# Patient Record
Sex: Female | Born: 1945
Health system: Southern US, Community
[De-identification: ages and names within clinical notes are randomized; demographics above are authoritative.]

## PROBLEM LIST (undated history)

## (undated) DIAGNOSIS — R9431 Abnormal electrocardiogram [ECG] [EKG]: Secondary | ICD-10-CM

## (undated) DIAGNOSIS — R0789 Other chest pain: Secondary | ICD-10-CM

## (undated) DIAGNOSIS — G4730 Sleep apnea, unspecified: Secondary | ICD-10-CM

## (undated) DIAGNOSIS — F329 Major depressive disorder, single episode, unspecified: Secondary | ICD-10-CM

## (undated) DIAGNOSIS — F32A Depression, unspecified: Secondary | ICD-10-CM

## (undated) DIAGNOSIS — I491 Atrial premature depolarization: Secondary | ICD-10-CM

## (undated) DIAGNOSIS — I493 Ventricular premature depolarization: Secondary | ICD-10-CM

## (undated) DIAGNOSIS — K219 Gastro-esophageal reflux disease without esophagitis: Secondary | ICD-10-CM

## (undated) DIAGNOSIS — E876 Hypokalemia: Secondary | ICD-10-CM

## (undated) DIAGNOSIS — I10 Essential (primary) hypertension: Secondary | ICD-10-CM

## (undated) DIAGNOSIS — L570 Actinic keratosis: Secondary | ICD-10-CM

## (undated) DIAGNOSIS — I2510 Atherosclerotic heart disease of native coronary artery without angina pectoris: Secondary | ICD-10-CM

## (undated) DIAGNOSIS — R109 Unspecified abdominal pain: Secondary | ICD-10-CM

## (undated) DIAGNOSIS — E785 Hyperlipidemia, unspecified: Secondary | ICD-10-CM

## (undated) DIAGNOSIS — N63 Unspecified lump in unspecified breast: Secondary | ICD-10-CM

## (undated) HISTORY — PX: CATARACT EXTRACTION: SUR2

## (undated) HISTORY — DX: Sleep apnea, unspecified: G47.30

## (undated) HISTORY — DX: Atrial premature depolarization: I49.1

## (undated) HISTORY — DX: Unspecified lump in unspecified breast: N63.0

## (undated) HISTORY — PX: VESICOVAGINAL FISTULA CLOSURE W/ TAH: SUR271

## (undated) HISTORY — DX: Abnormal electrocardiogram (ECG) (EKG): R94.31

## (undated) HISTORY — DX: Essential (primary) hypertension: I10

## (undated) HISTORY — DX: Gastro-esophageal reflux disease without esophagitis: K21.9

## (undated) HISTORY — PX: TOTAL ABDOMINAL HYSTERECTOMY W/ BILATERAL SALPINGOOPHORECTOMY: SHX83

## (undated) HISTORY — DX: Atherosclerotic heart disease of native coronary artery without angina pectoris: I25.10

## (undated) HISTORY — DX: Unspecified abdominal pain: R10.9

## (undated) HISTORY — DX: Actinic keratosis: L57.0

## (undated) HISTORY — PX: APPENDECTOMY: SHX54

## (undated) HISTORY — DX: Hypokalemia: E87.6

## (undated) HISTORY — DX: Ventricular premature depolarization: I49.3

## (undated) HISTORY — DX: Depression, unspecified: F32.A

## (undated) HISTORY — DX: Other chest pain: R07.89

## (undated) HISTORY — PX: COLONOSCOPY: SHX174

## (undated) HISTORY — DX: Hyperlipidemia, unspecified: E78.5

## (undated) HISTORY — DX: Major depressive disorder, single episode, unspecified: F32.9

## (undated) HISTORY — PX: CARDIAC CATHETERIZATION: SHX172

---

## 1999-12-21 HISTORY — PX: OTHER SURGICAL HISTORY: SHX169

## 2005-01-04 ENCOUNTER — Ambulatory Visit: Payer: Self-pay | Admitting: Gerontology

## 2005-01-07 ENCOUNTER — Ambulatory Visit: Payer: Self-pay

## 2005-03-01 ENCOUNTER — Ambulatory Visit: Payer: Self-pay | Admitting: Internal Medicine

## 2006-04-13 ENCOUNTER — Ambulatory Visit: Payer: Self-pay | Admitting: Internal Medicine

## 2007-01-26 ENCOUNTER — Other Ambulatory Visit: Payer: Self-pay

## 2007-02-03 ENCOUNTER — Ambulatory Visit: Payer: Self-pay | Admitting: Unknown Physician Specialty

## 2007-04-20 ENCOUNTER — Ambulatory Visit: Payer: Self-pay | Admitting: Internal Medicine

## 2007-05-30 ENCOUNTER — Other Ambulatory Visit: Payer: Self-pay

## 2007-05-30 ENCOUNTER — Inpatient Hospital Stay: Payer: Self-pay | Admitting: Internal Medicine

## 2007-06-06 ENCOUNTER — Ambulatory Visit: Payer: Self-pay | Admitting: Internal Medicine

## 2007-06-13 ENCOUNTER — Ambulatory Visit: Payer: Self-pay | Admitting: Internal Medicine

## 2007-07-05 ENCOUNTER — Ambulatory Visit: Payer: Self-pay | Admitting: Internal Medicine

## 2007-10-02 ENCOUNTER — Ambulatory Visit: Payer: Self-pay | Admitting: Physician Assistant

## 2008-04-22 ENCOUNTER — Ambulatory Visit: Payer: Self-pay | Admitting: Internal Medicine

## 2008-06-20 ENCOUNTER — Ambulatory Visit: Payer: Self-pay | Admitting: Internal Medicine

## 2008-07-22 ENCOUNTER — Ambulatory Visit: Payer: Self-pay | Admitting: Internal Medicine

## 2009-06-17 ENCOUNTER — Ambulatory Visit: Payer: Self-pay | Admitting: Internal Medicine

## 2009-07-08 ENCOUNTER — Ambulatory Visit: Payer: Self-pay | Admitting: Internal Medicine

## 2010-01-01 ENCOUNTER — Encounter: Payer: Self-pay | Admitting: Internal Medicine

## 2010-01-06 ENCOUNTER — Encounter: Payer: Self-pay | Admitting: Internal Medicine

## 2010-02-09 ENCOUNTER — Ambulatory Visit: Payer: Self-pay | Admitting: Internal Medicine

## 2010-02-09 DIAGNOSIS — I4949 Other premature depolarization: Secondary | ICD-10-CM | POA: Insufficient documentation

## 2010-02-09 DIAGNOSIS — E7800 Pure hypercholesterolemia, unspecified: Secondary | ICD-10-CM | POA: Insufficient documentation

## 2010-02-09 DIAGNOSIS — I10 Essential (primary) hypertension: Secondary | ICD-10-CM | POA: Insufficient documentation

## 2010-02-09 DIAGNOSIS — I4891 Unspecified atrial fibrillation: Secondary | ICD-10-CM | POA: Insufficient documentation

## 2010-02-13 ENCOUNTER — Ambulatory Visit: Payer: Self-pay | Admitting: Physician Assistant

## 2010-08-05 ENCOUNTER — Ambulatory Visit: Payer: Self-pay | Admitting: Internal Medicine

## 2010-12-30 ENCOUNTER — Encounter: Payer: Self-pay | Admitting: Internal Medicine

## 2011-01-20 NOTE — Assessment & Plan Note (Signed)
Summary: ROV/AMD   Visit Type:  Follow-up Primary Provider:  Daniel Nones, MD  CC:  no complaints.  History of Present Illness: Christina Underwood is seen at the request of Dr. Daniel Nones 4 followup of PVCs and PACs that were very symptomatic and were treated with amiodarone about 4 years ago. We last saw the patient about 2-1/2 years ago.  She has no significant palpitations. She denies balance issues, GI issues, photophobia, alopecia. Dr. Graciela Husbands has check LFTs and TSH as it is this month or so ago. These were reportedly normal.  She underwent catheterization many years ago with normal left ventricular function. An echo an emergent echo showed mild depression of LV systolic function. She does not recall any intercurrent evaluation.  The patient denies SOB, chest pain, edema or palpitations.     Preventive Screening-Counseling & Management  Alcohol-Tobacco     Alcohol drinks/day: 0     Smoking Status: never  Caffeine-Diet-Exercise     Caffeine use/day: 2 cups a day     Does Patient Exercise: no  Current Problems (verified): 1)  Cad, Native Vessel  (ICD-414.01) 2)  Chest Pain-unspecified  (ICD-786.50) 3)  Hypertension, Benign  (ICD-401.1) 4)  Hypercholesterolemia Iia  (ICD-272.0)  Current Medications (verified): 1)  Micardis Hct 80-25 Mg Tabs (Telmisartan-Hctz) .Marland Kitchen.. 1 By Mouth Once Daily 2)  Lipitor 20 Mg Tabs (Atorvastatin Calcium) .Marland Kitchen.. 1 By Mouth Once Daily 3)  Premarin 0.625 Mg Tabs (Estrogens Conjugated) .Marland Kitchen.. 1 By Mouth Once Daily 4)  Bupropion Hcl 150 Mg Xr12h-Tab (Bupropion Hcl) .Marland Kitchen.. 1 By Mouth Once Daily 5)  Potassium Chloride Cr 10 Meq Cr-Caps (Potassium Chloride) .... Take One Tablet By Mouth Daily 6)  Amiodarone Hcl 200 Mg Tabs (Amiodarone Hcl) .... Take One Tablet By Mouth Daily 7)  Citalopram Hydrobromide 40 Mg Tabs (Citalopram Hydrobromide) .Marland Kitchen.. 1 By Mouth Once Daily 8)  Aspirin 81 Mg Tbec (Aspirin) .... Take One Tablet By Mouth Daily 9)  Zyrtec Hives Relief 10 Mg Tabs  (Cetirizine Hcl) .Marland Kitchen.. 1 By Mouth Once Daily 10)  Nexium 40 Mg Cpdr (Esomeprazole Magnesium) .Marland Kitchen.. 1 By Mouth Once Daily 11)  Multivitamins  Tabs (Multiple Vitamin) .Marland Kitchen.. 1 By Mouth Once Daily 12)  Calcium 1200-1000 Mg-Unit Chew (Calcium Carbonate-Vit D-Min) .Marland Kitchen.. 1 By Mouth Once Daily 13)  Ra Hair/skin/nails  Tabs (Multiple Vitamins-Minerals) .Marland Kitchen.. 1 By Mouth Once Daily  Allergies (verified): 1)  ! Sulfa  Past History:  Past Medical History: Last updated: 02/09/2010 CAD GERD Hyperlipidemia Hypertension Depression Atypical chest pain Abdominal discomfort in the right upper quadrant Hypokalemia Breast lump  Prolonged of QT syndrome  Sleep apnea  Past Surgical History: Last updated: 02/09/2010 Hysterectomy Bunion repairs bilaterally History of lumbar laminextomy 2001 Appendectomy  Social History: Last updated: 02/09/2010 Full Time Tobacco Use - No.  Alcohol Use - no  Risk Factors: Alcohol Use: 0 (02/09/2010) Caffeine Use: 2 cups a day (02/09/2010) Exercise: no (02/09/2010)  Risk Factors: Smoking Status: never (02/09/2010)  Social History: Alcohol drinks/day:  0 Caffeine use/day:  2 cups a day Does Patient Exercise:  no  Vital Signs:  Patient profile:   65 year old female Height:      65 inches Weight:      209.75 pounds BMI:     35.03 Pulse rate:   67 / minute Pulse rhythm:   regular BP sitting:   140 / 68  (left arm) Cuff size:   large  Vitals Entered By: Mercer Pod (February 09, 2010 10:14 AM)  EKG  Procedure date:  02/09/2010  Findings:      sinus rhythm at 67 Intervals 0.20/0.07/0.44 Axis is 11 Nonspecific ST-T changes  Impression & Recommendations:  Problem # 1:  PREMATURE VENTRICULAR CONTRACTIONS (ICD-427.69) The patient has very symptomatic PVCs. These have been largely eliminated by the use of amiodarone. She as tolerated it well without evidence of complications. Amiodarone surveillance is being done by Dr. Graciela Husbands  decrease  her amiodarone dose from 200-100 mg a day.  Her updated medication list for this problem includes:    Amiodarone Hcl 200 Mg Tabs (Amiodarone hcl) .Marland Kitchen... Take 1/2 tablet by mouth daily    Aspirin 81 Mg Tbec (Aspirin) .Marland Kitchen... Take one tablet by mouth daily  Problem # 2:  ENCOUNTER FOR AMIODARONE (ICD-V58.69) as above  Problem # 3:  CAD, NATIVE VESSEL-NONOBSTRUCTIVE (ICD-414.01) no symptoms we'll continue her on aspirin Her updated medication list for this problem includes:    Aspirin 81 Mg Tbec (Aspirin) .Marland Kitchen... Take one tablet by mouth daily  Patient Instructions: 1)  Your physician recommends that you schedule a follow-up appointment in: 1 year 2)  Your physician has recommended you make the following change in your medication: decrease amiodarone to 100 mg daily

## 2011-01-20 NOTE — Progress Notes (Signed)
Summary: [facility]   Imported By: Harlon Flor 01/08/2010 12:07:09  _____________________________________________________________________  External Attachment:    Type:   Image     Comment:   External Document

## 2011-02-12 ENCOUNTER — Encounter: Payer: Self-pay | Admitting: Internal Medicine

## 2011-02-12 ENCOUNTER — Ambulatory Visit (INDEPENDENT_AMBULATORY_CARE_PROVIDER_SITE_OTHER): Payer: BC Managed Care – PPO | Admitting: Internal Medicine

## 2011-02-12 DIAGNOSIS — Z7901 Long term (current) use of anticoagulants: Secondary | ICD-10-CM

## 2011-02-12 DIAGNOSIS — I4949 Other premature depolarization: Secondary | ICD-10-CM

## 2011-02-16 NOTE — Miscellaneous (Signed)
  Clinical Lists Changes  Medications: Changed medication from AMIODARONE HCL 200 MG TABS (AMIODARONE HCL) Take 1/2 tablet by mouth daily to AMIODARONE HCL 200 MG TABS (AMIODARONE HCL) Take 1/2 tablet by mouth 5 days per week - Signed

## 2011-02-16 NOTE — Assessment & Plan Note (Signed)
Summary: f1y/amd/smw 427.69; 427.61; v58.65   Visit Type:  Follow-up Primary Provider:  Daniel Nones, MD  CC:  "Doing well.".  History of Present Illness: Christina Underwood is seen in followup for  PVCs and PACs that were very symptomatic and were treated with amiodarone about 4 years ago.   she has had largely depression of her ectopy with the amiodaron; surveillance laboratories have been followed by Dr. Worthy Keeler.  These labs were reviewed today. A TSH and liver enzymes were normal. These date January 2012  She underwent catheterization many years ago with normal left ventricular function. An echo an emergent echo showed mild depression of LV systolic function. She does not recall any intercurrent evaluation.  The patient denies SOB, chest pain, edema or palpitations.     Current Medications (verified): 1)  Micardis Hct 80-25 Mg Tabs (Telmisartan-Hctz) .Marland Kitchen.. 1 By Mouth Once Daily 2)  Lipitor 40 Mg Tabs (Atorvastatin Calcium) .... One Tablet At Bedtime 3)  Premarin 0.625 Mg Tabs (Estrogens Conjugated) .Marland Kitchen.. 1 By Mouth Once Daily 4)  Bupropion Hcl 150 Mg Xr12h-Tab (Bupropion Hcl) .Marland Kitchen.. 1 By Mouth Once Daily 5)  Potassium Chloride Cr 10 Meq Cr-Caps (Potassium Chloride) .... Take One Tablet By Mouth Daily 6)  Amiodarone Hcl 200 Mg Tabs (Amiodarone Hcl) .... Take 1/2 Tablet By Mouth Daily 7)  Citalopram Hydrobromide 40 Mg Tabs (Citalopram Hydrobromide) .Marland Kitchen.. 1 By Mouth Once Daily 8)  Aspirin 81 Mg Tbec (Aspirin) .... Take One Tablet By Mouth Daily 9)  Zyrtec Hives Relief 10 Mg Tabs (Cetirizine Hcl) .Marland Kitchen.. 1 By Mouth Once Daily 10)  Nexium 40 Mg Cpdr (Esomeprazole Magnesium) .Marland Kitchen.. 1 By Mouth Once Daily 11)  Multivitamins  Tabs (Multiple Vitamin) .Marland Kitchen.. 1 By Mouth Once Daily 12)  Calcium 1200-1000 Mg-Unit Chew (Calcium Carbonate-Vit D-Min) .Marland Kitchen.. 1 By Mouth Once Daily 13)  Ra Hair/skin/nails  Tabs (Multiple Vitamins-Minerals) .Marland Kitchen.. 1 By Mouth Once Daily 14)  Benzonatate 200 Mg Caps (Benzonatate) ....  Three Times A Day For Cough. 15)  Doxycycline Hyclate 100 Mg Caps (Doxycycline Hyclate) .... Two Times A Day X 10 Days. 16)  Doxazosin Mesylate 1 Mg Tabs (Doxazosin Mesylate) .... One Daily  Allergies (verified): 1)  ! Sulfa  Past History:  Past Medical History: Last updated: 02/09/2010 CAD GERD Hyperlipidemia Hypertension Depression Atypical chest pain Abdominal discomfort in the right upper quadrant Hypokalemia Breast lump  Prolonged of QT syndrome  Sleep apnea  Past Surgical History: Last updated: 02/09/2010 Hysterectomy Bunion repairs bilaterally History of lumbar laminextomy 2001 Appendectomy  Social History: Last updated: 02/09/2010 Full Time Tobacco Use - No.  Alcohol Use - no  Risk Factors: Alcohol Use: 0 (02/09/2010) Caffeine Use: 2 cups a day (02/09/2010) Exercise: no (02/09/2010)  Risk Factors: Smoking Status: never (02/09/2010)  Vital Signs:  Patient profile:   65 year old female Height:      65 inches Weight:      202 pounds BMI:     33.74 Pulse rate:   63 / minute BP sitting:   102 / 72  (left arm) Cuff size:   large  Vitals Entered By: Bishop Dublin, CMA (February 12, 2011 10:31 AM)  Physical Exam  General:  The patient was alert and oriented in no acute distress. HEENT Normal.   Lungs were clear.  Heart sounds were regular without murmurs or gallops.  Abdomen was soft with active bowel sounds. There is no clubbing cyanosis or edema. Skin Warm and dry    EKG  Procedure date:  02/12/2011  Findings:      sinus rhythm at 63 Intervals 0.18/2007/25 through the QTC of 0.55  Impression & Recommendations:  Problem # 1:  PREMATURE VENTRICULAR CONTRACTIONS (ICD-427.69) these are well controlled on amiodarone. We will reduce the dose from 700-500 mg per week. Her updated medication list for this problem includes:    Amiodarone Hcl 200 Mg Tabs (Amiodarone hcl) .Marland Kitchen... Take 1/2 tablet by mouth daily    Aspirin 81 Mg Tbec (Aspirin)  .Marland Kitchen... Take one tablet by mouth daily  Problem # 2:  ABNORMAL ELECTROCARDIOGRAM-QT PROLONGATION 2/2 AMIODARONE (ICD-794.31) QT prolongation is almost totally a consequence of the amiodarone. Her potassium from last month was adequate at 3.7. Her updated medication list for this problem includes:    Amiodarone Hcl 200 Mg Tabs (Amiodarone hcl) .Marland Kitchen... Take 1/2 tablet by mouth daily    Aspirin 81 Mg Tbec (Aspirin) .Marland Kitchen... Take one tablet by mouth daily  Problem # 3:  ENCOUNTER FOR AMIODARONE (ICD-V58.69) she is tolerating this drug well. We will reduce the dose as noted above  Problem # 4:  CAD, NATIVE VESSEL-NONOBSTRUCTIVE (ICD-414.01) stable on current meds Her updated medication list for this problem includes:    Aspirin 81 Mg Tbec (Aspirin) .Marland Kitchen... Take one tablet by mouth daily

## 2011-05-04 NOTE — Letter (Signed)
June 13, 2007    Lynnea Ferrier, M.D.  [facility]  [address]  [place], [place]  [address]   RE:  Christina Underwood, Christina Underwood  MRN:  [id number]  /  DOB:  June 24, 1946   Dear Doristine Church:   It was a pleasure to see Christina Underwood today at your request regarding her  supraventricular ectopy.   Our sincere thanks also to Maralyn Sago, who was able to track down at least a  cover sheet for her Holter monitor.   As you know, she is a 65 year old woman whose past medical history is  quite complicated and includes depression and anxiety.  She has a  history of palpitations dating back a number of years for which she  first came to clinical attention in 2004 or so according to your notes.  At that time, she saw a Dr. Dian Queen at [facility].  Holter  monitoring demonstrated PACs and  brief atrial fibrillation.  She was  put on atenolol.  This was initially increased from 50 to 100, but this  was complicated by bradycardia. Subsequently, it was down titrated, and  then more recently it has been switched to bisoprolol.  It was her  feeling that the atenolol at 100 did quite well.  The atenolol at 50 did  not, and the bisoprolol has done nothing really at all.   She describes skipping and beating in her chest with these.  They are  not particularly associated with exercise or any other clear  provocateurs.  They are associated with presyncope, weakness, and being  quite fatigued.  She had some chest pain and left arm radiation with one  of these episodes just a couple of weeks ago, as you know, and was  hospitalized.  She had a Myoview scan, I thought by her history, and  then she went on to catheterization for reasons that were not clear,  demonstrating 25% RCA stenosis.   During the stress portion of her nuclear test, there were some comments  about not regular beats, but she is not sure anymore specifically what  that was.   The Holter monitor that Maralyn Sago was able to help US obtain describes  3-5%  PACs and PVCs.  We do not have morphology information to know how  diverse the origins of these beats are.  It is further her observation  that these spells have gotten worse over the last 3 years.   She does not think that they are aggravated by exercise, adrenalin,  eating, etc.   Further embolic risk factors in the event that she does develop atrial  fibrillation include hypertension.   PAST MEDICAL HISTORY:  In addition to above, is notable for:  1. Allergies.  2. GE reflux disease.  3. Fatigue.   PAST SURGICAL HISTORY:  Notable for:  1. Hysterectomy.  2. Salivary gland surgery.  3. Back surgery.   SOCIAL HISTORY:  She is married; she has 3 children.  She works doing  sewing.  She does not use cigarettes, alcohol, or recreational drugs.   CURRENT MEDICATIONS:  1. Lipitor 20.  2. Premarin.  3. Potassium.  4. Aspirin 81.  5. Bisoprolol 5.  6. Nexium 40.  7. Clarinex.  8. Bupropion 150.  9. Robaxin 750.   ALLERGIES:  No known drug allergies.   FAMILY HISTORY:  Noncontributory.   REVIEW OF SYSTEMS:  Noncontributory.   PHYSICAL EXAMINATION:  GENERAL:  She is an older Caucasian female  appearing somewhat older than  her stated age of 67.  VITAL SIGNS:  Blood pressure was 141/67 with a pulse of 56, and there  was no orthostatic change.  HEENT:  Exam demonstrated no icterus or xanthomata.  NECK:  The neck veins were flat.  The carotids were brisk and full  bilaterally without bruits.  BACK:  Without kyphosis or scoliosis.  LUNGS:  Clear.  CARDIAC:  Heart sounds were regular without murmurs or gallops.  ABDOMEN:  Soft with active bowel sounds, without midline pulsation or  hepatomegaly.  EXTREMITIES:  Femoral pulses were 2+.  Distal pulses were intact.  There  was no clubbing, cyanosis, or edema.  NEUROLOGIC:  Exam was grossly normal.  SKIN:  Warm and dry.   Electrocardiogram dated today demonstrated sinus rhythm at 59 with  intervals of 0.17/0.07/0.50.    IMPRESSION:  1. Symptomatic ventricular and supraventricular ectopy.  2. Nonobstructive coronary disease with a 25% right coronary artery      lesion, normal left ventricular function.  3. Long QT on electrocardiogram without evidence of long QT syndrome.  4. Anxiety and depression.   Doristine Church, Christina Underwood has symptomatic ectopy.  I tried to reassure her that  this was a non-life-threatening condition.  The issue of the presence of  nonobstructive coronary disease presents itself primarily in the  limitations that it has on drug therapy in that it would limit the use  of a 1C antiarrhythmic drug which I think would otherwise be quite  reasonable in her.   Alternatively, we have the use of calcium blockers and/or atrial-  ventricular remodeling agents, or antiarrhythmic drugs; as I mentioned  to you on the phone, I do not think ablation,  given the description of  her events, is reasonable, although I do not have access to the hard  copy of the rhythms themselves.   We discussed the potential benefits as well as the potential risks of  antiarrhythmic drugs.  With her coronary disease, as mentioned, she  would not be able to take a 1C agent, so she would be on sotalol,  amiodarone, or Tikosyn.  With QT prolongation, really the only drug  should could take is amiodarone.  We discussed the potential side  effects.   What we decided to do in the interim was to try an atrial-ventricular  remodeling agent in the form of an ARB, and I gave her Cozaar 150.  I  asked her to stop bisoprolol, and we put her on verapamil 120.  She is  supposed to see you in a couple of weeks, I think, and we will see how  it is that she is doing on that.   I told her to talk with you about how you would best like to follow up.  I am glad to help in whatever way I can, as frequently and as directly as you would like.  I will look forward to talking with you about her  prior to that.   I also need to explore her  medication list to see if any of them are  potentially responsible for QT prolongation on the electrocardiogram  because it really is quite striking.  It becomes important then also to  screen her children to see if they have evidence of QT prolongation and  certainly to note in her record that she should avoid QT prolonging  drugs.   Doristine Church, thanks very much for asking [place] to see her.    Sincerely,      Viviann Spare  C. Graciela Husbands, MD, [facility] [place]  Electronically Signed    SCK/MedQ  DD: 06/13/2007  DT: 06/13/2007  Job #: [id number]

## 2011-05-04 NOTE — Letter (Signed)
July 05, 2007    Dr. Daniel Nones  [facility]  [address]  [place], [place]  [address]   RE:  Christina Underwood  MRN:  [id number]  /  DOB:  1946-11-11   Dear Christina Underwood:   Christina Underwood came in today.  She has started to feel better over the last  couple of days since we initiated the amiodarone.   She mentioned that you had done the surveillance labs for the TSH and  LFTs.  I presume that they were normal.  She brings her PFTs with her  and her DLCO was 82% which is okay, and serves as a baseline.  She is  having no nausea at this point.   Her blood pressure remains a problem.  She notes that it is increased in  the morning.  I asked her whether she snored and she says she does not  know, but she does wake up with a.m. headaches.  She has daytime  somnolence and is poorly rested most of the time.   Her medication list is legion, but right now it includes Avapro 300 and  amiodarone 200 b.i.d., as well as potassium 10 b.i.d.  She takes her  furosemide as needed.   PHYSICAL EXAMINATION:  On examination today, her blood pressure is  152/76.  On a recheck it was 132/62, her pulse was 60, her weight is 203  which is up, according to our scales, by 6 pounds.  The lungs were clear.  Heart sounds were regular.  Extremities had no edema.   IMPRESSION:  1. Symptomatic PACs and PVCs.  2. Amiodarone initiated for #1.  3. Hypertension.  4. Daytime somnolence, a.m. headaches, question obstructive sleep      apnea.   Christina Underwood, Christina Underwood is doing pretty well.  I thought we would keep her on  her amiodarone at 200 twice a day for 3 weeks, and then, say, on August  5th, we will decrease the amiodarone to once a day.  I will look forward  to talking to you about how best we can follow her on her amiodarone so  that I can best assist you and not duplicate her care.   Initially what I will do is I will plan to see her in about 3 months  just to make sure she is tolerating the drug.   I am  also working on trying to get her an amiodarone surveillance form  so she has that with her.   As mentioned in the voice mail, I think there may be some value in a  sleep study, but I will defer that to you.   I hope this letter finds you well.  Let me know if there is anything I  can do.    Sincerely,      Duke Salvia, MD, [facility]  Electronically Signed    SCK/MedQ  DD: 07/05/2007  DT: 07/05/2007  Job #: [id number]

## 2011-07-07 ENCOUNTER — Encounter: Payer: Self-pay | Admitting: Internal Medicine

## 2011-08-02 ENCOUNTER — Encounter: Payer: Self-pay | Admitting: Internal Medicine

## 2011-08-26 ENCOUNTER — Ambulatory Visit: Payer: Self-pay | Admitting: Internal Medicine

## 2011-10-29 ENCOUNTER — Ambulatory Visit: Payer: Self-pay | Admitting: Internal Medicine

## 2011-11-05 ENCOUNTER — Other Ambulatory Visit: Payer: Self-pay | Admitting: Gastroenterology

## 2011-12-01 ENCOUNTER — Ambulatory Visit: Payer: Self-pay | Admitting: Gastroenterology

## 2011-12-01 ENCOUNTER — Other Ambulatory Visit: Payer: Self-pay | Admitting: Gastroenterology

## 2011-12-22 ENCOUNTER — Ambulatory Visit: Payer: Self-pay | Admitting: Gastroenterology

## 2011-12-23 LAB — PATHOLOGY REPORT

## 2012-08-28 ENCOUNTER — Ambulatory Visit: Payer: Self-pay | Admitting: Internal Medicine

## 2012-08-29 ENCOUNTER — Ambulatory Visit: Payer: Self-pay | Admitting: Internal Medicine

## 2013-02-13 ENCOUNTER — Encounter: Payer: Self-pay | Admitting: Internal Medicine

## 2013-02-13 ENCOUNTER — Ambulatory Visit (INDEPENDENT_AMBULATORY_CARE_PROVIDER_SITE_OTHER): Payer: Medicare Other | Admitting: Internal Medicine

## 2013-02-13 VITALS — BP 122/68 | HR 74 | Ht 66.0 in | Wt 201.8 lb

## 2013-02-13 DIAGNOSIS — I429 Cardiomyopathy, unspecified: Secondary | ICD-10-CM | POA: Insufficient documentation

## 2013-02-13 DIAGNOSIS — I428 Other cardiomyopathies: Secondary | ICD-10-CM

## 2013-02-13 DIAGNOSIS — R0602 Shortness of breath: Secondary | ICD-10-CM

## 2013-02-13 DIAGNOSIS — R079 Chest pain, unspecified: Secondary | ICD-10-CM

## 2013-02-13 DIAGNOSIS — I4949 Other premature depolarization: Secondary | ICD-10-CM

## 2013-02-13 MED ORDER — AMIODARONE HCL 200 MG PO TABS: ORAL_TABLET | ORAL | Status: DC

## 2013-02-13 NOTE — Patient Instructions (Addendum)
Your physician has recommended you make the following change in your medication:  -decrease amiodarone to 4 days a week  Your physician wants you to follow-up in: 1 year with Dr. Graciela Husbands. You will receive a reminder letter in the mail two months in advance. If you don't receive a letter, please call our office to schedule the follow-up appointment.

## 2013-02-13 NOTE — Assessment & Plan Note (Signed)
Previously noted to have a mild cardiomyopathy with an EF of 40-45%. I'll ask Dr. Gretel Acre to repeat her ultrasound. In the event that she has modest depression of LV function, continue her ACE inhibitor therapy and perhaps adding low-dose beta blocker would be appropriate

## 2013-02-13 NOTE — Assessment & Plan Note (Signed)
The patient has had symptomatic PACs and PVCs for years. She is on low-dose amiodarone. There is no evidence of amiodarone toxicity. We will decrease her amiodarone from 5 days a week-4 days a week.  Is reasonable to undertake pulmonary function testing particularly, not withstanding the absence of symptoms, that she is using the amiodarone for a relatively low risk issue. We will decrease the dose as noted.

## 2013-02-13 NOTE — Progress Notes (Signed)
Patient Care Team: Daniel Nones as PCP - General (Internal Medicine)   HPI  Christina Underwood is a 67 y.o. female Seen in followup after a hiatus of about 2 years. She has symptomatic PACs and PVCs. She was treated with amiodarone.she has been on low-dose therapy for some years, taking 100 mg 5 days a week. Surveillance laboratories have been followed by Dr. Gretel Acre.  She denies cough shortness of breath GI symptoms tremors gait disturbance or some sensitivity.  Remote catheterization demonstrated normal left ventricular function. A subsequent echo had showed mild depression of LV systolic function.  Past Medical History  Diagnosis Date  . CAD (coronary artery disease)   . GERD (gastroesophageal reflux disease)   . HLD (hyperlipidemia)   . HTN (hypertension)   . Depression   . Atypical chest pain   . Abdominal discomfort     R upper quad  . Hypokalemia   . Breast lump   . Sleep apnea   . Prolonged QT syndrome     Past Surgical History  Procedure Laterality Date  . Vesicovaginal fistula closure w/ tah    . Bunion repairs      bilateral   . Lumbar laminextomy  2001  . Appendectomy    . Total abdominal hysterectomy w/ bilateral salpingoophorectomy    . Cardiac catheterization      [facility]  . Cardiac catheterization      [facility].    Current Outpatient Prescriptions  Medication Sig Dispense Refill  . amiodarone (PACERONE) 200 MG tablet Take 100 mg by mouth. 5 days/week       . aspirin 81 MG EC tablet Take 81 mg by mouth daily.        Marland Kitchen atorvastatin (LIPITOR) 40 MG tablet Take 40 mg by mouth at bedtime.       . benzonatate (TESSALON) 200 MG capsule Take 200 mg by mouth 3 (three) times daily as needed.        Marland Kitchen buPROPion (WELLBUTRIN SR) 150 MG 12 hr tablet Take 150 mg by mouth daily.        . Calcium 1200-1000 MG-UNIT CHEW Chew 1 capsule by mouth daily.        . cetirizine (ZYRTEC) 10 MG tablet Take 10 mg by mouth daily.        . citalopram (CELEXA) 40 MG tablet Take 40  mg by mouth daily.        Marland Kitchen doxazosin (CARDURA) 1 MG tablet Take 1 mg by mouth daily.        Marland Kitchen esomeprazole (NEXIUM) 40 MG capsule Take 40 mg by mouth daily.        Marland Kitchen estrogens, conjugated, (PREMARIN) 0.625 MG tablet Take 0.625 mg by mouth daily. Take daily for 21 days then do not take for 7 days.       . Multiple Vitamin (MULTIVITAMIN) tablet Take 1 tablet by mouth daily.        . Multiple Vitamins-Minerals (RA HAIR/SKIN/NAILS) TABS Take 1 tablet by mouth daily.        . potassium chloride (MICRO-K) 10 MEQ CR capsule Take 10 mEq by mouth daily.        Marland Kitchen telmisartan-hydrochlorothiazide (MICARDIS HCT) 80-25 MG per tablet Take 1 tablet by mouth daily.         No current facility-administered medications for this visit.    Allergies  Allergen Reactions  . Sulfonamide Derivatives     Review of Systems negative except from HPI and PMH  Physical Exam  BP 122/68  Pulse 74  Ht 5\' 6"  (1.676 m)  Wt 201 lb 12 oz (91.513 kg)  BMI 32.58 kg/m2 Well developed and well nourished in no acute distress HENT normal E scleral and icterus clear Neck Supple JVP flat; carotids brisk and full Clear to ausculation Regular rate and rhythm, no murmurs gallops or rub Soft with active bowel sounds No clubbing cyanosis Trace Edema Alert and oriented, grossly normal motor and sensory function Skin Warm and Dry  ECG demonstrates sinus rhythm at 74 Intervals 18/08/41 Nonspecific T wave changes  Assessment and  Plan

## 2013-07-25 ENCOUNTER — Other Ambulatory Visit: Payer: Self-pay

## 2013-08-30 ENCOUNTER — Ambulatory Visit: Payer: Self-pay | Admitting: Internal Medicine

## 2013-09-21 ENCOUNTER — Ambulatory Visit: Payer: Self-pay

## 2013-10-25 ENCOUNTER — Other Ambulatory Visit: Payer: Self-pay

## 2014-04-19 ENCOUNTER — Ambulatory Visit (INDEPENDENT_AMBULATORY_CARE_PROVIDER_SITE_OTHER): Payer: Medicare HMO

## 2014-04-19 ENCOUNTER — Encounter: Payer: Self-pay | Admitting: Podiatry

## 2014-04-19 ENCOUNTER — Ambulatory Visit (INDEPENDENT_AMBULATORY_CARE_PROVIDER_SITE_OTHER): Payer: Medicare HMO | Admitting: Podiatry

## 2014-04-19 VITALS — Resp 16 | Ht 64.0 in | Wt 200.0 lb

## 2014-04-19 DIAGNOSIS — M898X9 Other specified disorders of bone, unspecified site: Secondary | ICD-10-CM

## 2014-04-19 DIAGNOSIS — M79609 Pain in unspecified limb: Secondary | ICD-10-CM

## 2014-04-19 DIAGNOSIS — M7750 Other enthesopathy of unspecified foot: Secondary | ICD-10-CM

## 2014-04-19 DIAGNOSIS — M79673 Pain in unspecified foot: Secondary | ICD-10-CM

## 2014-04-19 MED ORDER — TRIAMCINOLONE ACETONIDE 10 MG/ML IJ SUSP
10.0000 mg | Freq: Once | INTRAMUSCULAR | Status: AC
  Administered 2014-04-19: 10 mg

## 2014-04-19 NOTE — Progress Notes (Signed)
   Subjective:    Patient ID: Christina Underwood, female    DOB: 13-Aug-1946, 68 y.o.   MRN: [id number]  HPI Comments: i have pain on the top of my right foot and its been going on for about 2 months. i put some cream on my foot and take ibuprofen. It hurts worse at night. Over the months the pain is getting worse.  Foot Pain Associated symptoms include fatigue.      Review of Systems  Constitutional: Positive for fatigue.  Cardiovascular: Positive for leg swelling.  Musculoskeletal:       Back pain  All other systems reviewed and are negative.      Objective:   Physical Exam        Assessment & Plan:

## 2014-04-21 NOTE — Progress Notes (Signed)
Subjective:     Patient ID: Christina Underwood, female   DOB: [birth year], 68 y.o.   MRN: [id number]  Foot Pain   patient presents with pain on top of the right foot that is becoming increasingly difficult to walk with and she does not remember a specific injury. Has tried different creams and ibuprofen   Review of Systems  All other systems reviewed and are negative.      Objective:   Physical Exam  Nursing note and vitals reviewed. Constitutional: She is oriented to person, place, and time.  Cardiovascular: Intact distal pulses.   Musculoskeletal: Normal range of motion.  Neurological: She is oriented to person, place, and time.  Skin: Skin is warm.   neurovascular status intact with muscle strength adequate and discomfort on top of the right foot with inflammation and fluid buildup within the extensor complex. Did not note any muscle strength loss     Assessment:     Tendinitis of the dorsum right foot with inflammation and fluid buildup noted    Plan:     H&P and x-rays reviewed. Spent a great deal of time educating patient on problem and today I went ahead and didn't dorsal injection 3 mg Kenalog 5 mg I can Marcaine mixture and advised him reduced activity. Reappoint her recheck in 2 weeks

## 2014-05-10 ENCOUNTER — Encounter: Payer: Self-pay | Admitting: Podiatry

## 2014-05-10 ENCOUNTER — Ambulatory Visit (INDEPENDENT_AMBULATORY_CARE_PROVIDER_SITE_OTHER): Payer: Medicare HMO | Admitting: Podiatry

## 2014-05-10 VITALS — BP 134/63 | HR 73 | Resp 16

## 2014-05-10 DIAGNOSIS — M7750 Other enthesopathy of unspecified foot: Secondary | ICD-10-CM

## 2014-05-10 MED ORDER — TRIAMCINOLONE ACETONIDE 10 MG/ML IJ SUSP
10.0000 mg | Freq: Once | INTRAMUSCULAR | Status: AC
  Administered 2014-05-10: 10 mg

## 2014-05-10 NOTE — Progress Notes (Signed)
Subjective:     Patient ID: Christina Underwood, female   DOB: 25-Jan-1946, 68 y.o.   MRN: [id number]  HPI patient presents stating that it does not hurt where the injection was but it is now more around the first metatarsal base that is sore   Review of Systems     Objective:   Physical Exam Neurovascular status intact with no health history changes noted and pain around the first metatarsocuneiform joint noted. The discomfort in the dorsum of the foot extensor tendon complex is quite improved    Assessment:     Tendinitis of the anterior tibial tendon with improvement dorsal    Plan:     Injected around the anterior tibial tendon and first metatarsocuneiform area with 3 mg Kenalog 5 mg Xylocaine Marcaine mixture. Reappoint if symptoms persist

## 2014-08-08 ENCOUNTER — Encounter: Payer: Self-pay | Admitting: *Deleted

## 2014-08-15 ENCOUNTER — Ambulatory Visit: Payer: Self-pay | Admitting: Cardiovascular Disease

## 2014-09-06 ENCOUNTER — Ambulatory Visit: Payer: Self-pay | Admitting: Internal Medicine

## 2014-09-19 ENCOUNTER — Ambulatory Visit: Payer: Self-pay | Admitting: Internal Medicine

## 2014-10-28 DIAGNOSIS — R21 Rash and other nonspecific skin eruption: Secondary | ICD-10-CM | POA: Insufficient documentation

## 2015-01-07 ENCOUNTER — Encounter: Payer: Self-pay | Admitting: Internal Medicine

## 2015-01-07 ENCOUNTER — Ambulatory Visit (INDEPENDENT_AMBULATORY_CARE_PROVIDER_SITE_OTHER): Payer: Medicare Other | Admitting: Internal Medicine

## 2015-01-07 VITALS — BP 138/74 | HR 65 | Ht 64.5 in | Wt 202.5 lb

## 2015-01-07 DIAGNOSIS — R55 Syncope and collapse: Secondary | ICD-10-CM | POA: Insufficient documentation

## 2015-01-07 DIAGNOSIS — R079 Chest pain, unspecified: Secondary | ICD-10-CM | POA: Insufficient documentation

## 2015-01-07 DIAGNOSIS — R072 Precordial pain: Secondary | ICD-10-CM

## 2015-01-07 DIAGNOSIS — Z79899 Other long term (current) drug therapy: Secondary | ICD-10-CM

## 2015-01-07 NOTE — Patient Instructions (Signed)
[place]  Your caregiver has ordered a Stress Test with nuclear imaging. The purpose of this test is to evaluate the blood supply to your heart muscle. This procedure is referred to as a "Non-Invasive Stress Test." This is because other than having an IV started in your vein, nothing is inserted or "invades" your body. Cardiac stress tests are done to find areas of poor blood flow to the heart by determining the extent of coronary artery disease (CAD). Some patients exercise on a treadmill, which naturally increases the blood flow to your heart, while others who are  unable to walk on a treadmill due to physical limitations have a pharmacologic/chemical stress agent called Lexiscan . This medicine will mimic walking on a treadmill by temporarily increasing your coronary blood flow.   Please note: these test may take anywhere between 2-4 hours to complete  PLEASE REPORT TO [facility] AT THE FIRST DESK WILL DIRECT YOU WHERE TO GO  Date of Procedure:________1/22/16_____________________________  Arrival Time for Procedure:_______0745 am_______________________   PLEASE NOTIFY THE OFFICE AT LEAST 24 HOURS IN ADVANCE IF YOU ARE UNABLE TO KEEP YOUR APPOINTMENT.  [phone] AND  PLEASE NOTIFY NUCLEAR MEDICINE AT [facility] AT LEAST 24 HOURS IN ADVANCE IF YOU ARE UNABLE TO KEEP YOUR APPOINTMENT. [phone]  How to prepare for your Myoview test:  1. Do not eat or drink after midnight 2. No caffeine for 24 hours prior to test 3. No smoking 24 hours prior to test. 4. Your medication may be taken with water.  If your doctor stopped a medication because of this test, do not take that medication. 5. Ladies, please do not wear dresses.  Skirts or pants are appropriate. Please wear a short sleeve shirt. 6. No perfume, cologne or lotion. 7. Wear comfortable walking shoes. No heels!       Your physician has recommended that you wear an event monitor. Event monitors are  medical devices that record the heart's electrical activity. Doctors most often [place] these monitors to diagnose arrhythmias. Arrhythmias are problems with the speed or rhythm of the heartbeat. The monitor is a small, portable device. You can wear one while you do your normal daily activities. This is usually used to diagnose what is causing palpitations/syncope (passing out).  Your physician recommends that you schedule a follow-up appointment in:  5-6 weeks with Dr. Caryl Comes

## 2015-01-07 NOTE — Progress Notes (Signed)
Patient Care Team: [name] as PCP - General (Internal Medicine)   HPI  Christina Underwood is a 69 y.o. female Seen in followup after a hiatus of about 2 years. She has symptomatic PACs and PVCs. She was treated with amiodarone.she has been on low-dose therapy for some years, taking 100 mg 5 days a week. Surveillance laboratories have been followed by Dr. Roney Marion.   Remote catheterization 2008  demonstrated normal left ventricular function and no noteworthy CAD A subsequent echo had showed mild depression of LV systolic function.  She comes in today with complaints over the last couple of months chest discomfort which she describes as a heaviness in her left chest that radiates into her left shoulder. It is accompanied on occasion by diaphoresis and shortness of breath. It is unassociated with nausea. Curiously, it can last for up to 72 hours. It is unassociated with provocative can identify more than anything that she can leave.  She's also had over the last couple of months episodes of presyncope that lasts 5-10 seconds. They're unassociated with palpitations. There are no residual epiphenomena orthostasis.   Past Medical History  Diagnosis Date  . CAD (coronary artery disease)     25% RCA 2008   . GERD (gastroesophageal reflux disease)   . HLD (hyperlipidemia)   . HTN (hypertension)   . Depression   . Atypical chest pain   . Abdominal discomfort     R upper quad  . Hypokalemia   . Breast lump   . Sleep apnea   . Long QT interval     assoc with amiodarone  . PAC (premature atrial contraction)   . Right ventricular outflow tract premature ventricular contractions (PVCs)     Past Surgical History  Procedure Laterality Date  . Vesicovaginal fistula closure w/ tah    . Bunion repairs      bilateral   . Lumbar laminextomy  2001  . Appendectomy    . Total abdominal hysterectomy w/ bilateral salpingoophorectomy    . Colonoscopy    . Cardiac catheterization      [facility]  .  Cardiac catheterization      [facility].    Current Outpatient Prescriptions  Medication Sig Dispense Refill  . amiodarone (PACERONE) 200 MG tablet Take 100 mg by mouth every other day.     Marland Kitchen aspirin 81 MG EC tablet Take 81 mg by mouth daily.      Marland Kitchen atorvastatin (LIPITOR) 40 MG tablet Take 40 mg by mouth at bedtime.     . calcium carbonate (OS-CAL) 600 MG TABS tablet Take 600 mg by mouth daily with breakfast.    . cetirizine (ZYRTEC) 10 MG tablet Take 10 mg by mouth daily.      Marland Kitchen escitalopram (LEXAPRO) 10 MG tablet Take 10 mg by mouth daily.    Marland Kitchen esomeprazole (NEXIUM) 40 MG capsule Take 40 mg by mouth daily.      . valsartan-hydrochlorothiazide (DIOVAN-HCT) 320-25 MG per tablet Take 1 tablet by mouth daily.      No current facility-administered medications for this visit.    Allergies  Allergen Reactions  . Sulfonamide Derivatives     Review of Systems negative except from HPI and PMH  Physical Exam BP 138/74 mmHg  Pulse 65  Ht 5' 4.5" (1.638 m)  Wt 202 lb 8 oz (91.853 kg)  BMI 34.23 kg/m2 Well developed and well nourished in no acute distress HENT normal E scleral and icterus clear Neck  Supple JVP flat; carotids brisk and full Clear to ausculation Regular rate and rhythm, no murmurs gallops or rub Soft with active bowel sounds No clubbing cyanosis Trace Edema Alert and oriented, grossly normal motor and sensory function Skin Warm and Dry  ECG demonstrates sinus rhythm at 74 Intervals 18/08/41 Nonspecific T wave changes  Assessment and  Plan  PACs/PVCs  Amiodarone therapy-high risk  Chest pain-atypical  Near-syncope  Cardiomyopathy-mild  Hypertension  Palpitations are well controlled. She remains on low-dose amiodarone. Surveillance laboratories were normal 8/15. These labs were reviewed today. They will need to be repeated at her next visit  Her chest pain is atypical although concerning still given its atypical features. Knowing that she had  some albeit no obstructive coronary disease in 2008 we will undertake Myoview scanning.  Her near syncopal episodes are quite brief. This would speak to a bradycardia arrhythmic process as opposed to a vaso motor process. I have reviewed this physiology with the patient. We will plan to undertake a 30 day event recorder and review the issues at the next visit.  Blood pressure is reasonably controlled at this time.  We will reassess her left ventricular function time of her Myoview scan.

## 2015-01-09 ENCOUNTER — Telehealth: Payer: Self-pay | Admitting: *Deleted

## 2015-01-09 NOTE — Telephone Encounter (Signed)
Enrolled in ecardio 1/21

## 2015-01-13 ENCOUNTER — Other Ambulatory Visit: Payer: Self-pay

## 2015-01-13 ENCOUNTER — Ambulatory Visit: Payer: Self-pay | Admitting: Internal Medicine

## 2015-01-13 DIAGNOSIS — R55 Syncope and collapse: Secondary | ICD-10-CM | POA: Diagnosis not present

## 2015-01-13 DIAGNOSIS — R079 Chest pain, unspecified: Secondary | ICD-10-CM

## 2015-01-30 ENCOUNTER — Ambulatory Visit: Payer: Self-pay | Admitting: Ophthalmology

## 2015-02-18 ENCOUNTER — Encounter: Payer: Self-pay | Admitting: Internal Medicine

## 2015-02-18 ENCOUNTER — Ambulatory Visit (INDEPENDENT_AMBULATORY_CARE_PROVIDER_SITE_OTHER): Payer: Medicare Other | Admitting: Internal Medicine

## 2015-02-18 VITALS — BP 145/76 | HR 58 | Ht 66.5 in | Wt 207.5 lb

## 2015-02-18 DIAGNOSIS — F329 Major depressive disorder, single episode, unspecified: Secondary | ICD-10-CM | POA: Insufficient documentation

## 2015-02-18 DIAGNOSIS — K219 Gastro-esophageal reflux disease without esophagitis: Secondary | ICD-10-CM | POA: Insufficient documentation

## 2015-02-18 DIAGNOSIS — I4891 Unspecified atrial fibrillation: Secondary | ICD-10-CM

## 2015-02-18 DIAGNOSIS — E876 Hypokalemia: Secondary | ICD-10-CM | POA: Insufficient documentation

## 2015-02-18 DIAGNOSIS — I4892 Unspecified atrial flutter: Secondary | ICD-10-CM

## 2015-02-18 DIAGNOSIS — I4581 Long QT syndrome: Secondary | ICD-10-CM | POA: Insufficient documentation

## 2015-02-18 DIAGNOSIS — E785 Hyperlipidemia, unspecified: Secondary | ICD-10-CM | POA: Insufficient documentation

## 2015-02-18 DIAGNOSIS — F32A Depression, unspecified: Secondary | ICD-10-CM | POA: Insufficient documentation

## 2015-02-18 DIAGNOSIS — G4730 Sleep apnea, unspecified: Secondary | ICD-10-CM | POA: Insufficient documentation

## 2015-02-18 NOTE — Progress Notes (Signed)
Patient Care Team: [name] as PCP - General (Internal Medicine)   HPI  Christina Underwood is a 69 y.o. female Seen in followup after a hiatus of about 2 years. She has symptomatic PACs and PVCs. She was treated with amiodarone.she has been on low-dose therapy for some years, taking 100 mg 5 days a week. Surveillance laboratories have been followed by Dr. Roney Marion.   Remote catheterization 2008  demonstrated normal left ventricular function and no noteworthy CAD A subsequent echo had showed mild depression of LV systolic function.  She was seen a couple of months ago with complaints of chest pain that were somewhat atypical as well as very brief syncopal spells. She underwent Myoview scanning which demonstrated no ischemia infarction and normal LV function. She was also given an event recorder during which time she had not of her spells. Was symptoms she did have correlated with either sinus rhythm or PVCs.  Past Medical History  Diagnosis Date  . CAD (coronary artery disease)     25% RCA 2008   . GERD (gastroesophageal reflux disease)   . HLD (hyperlipidemia)   . HTN (hypertension)   . Depression   . Atypical chest pain   . Abdominal discomfort     R upper quad  . Hypokalemia   . Breast lump   . Sleep apnea   . Long QT interval     assoc with amiodarone  . PAC (premature atrial contraction)   . Right ventricular outflow tract premature ventricular contractions (PVCs)     Past Surgical History  Procedure Laterality Date  . Vesicovaginal fistula closure w/ tah    . Bunion repairs      bilateral   . Lumbar laminextomy  2001  . Appendectomy    . Total abdominal hysterectomy w/ bilateral salpingoophorectomy    . Colonoscopy    . Cardiac catheterization      [facility]  . Cardiac catheterization      [facility].  . Cataract extraction Right     Current Outpatient Prescriptions  Medication Sig Dispense Refill  . amiodarone (PACERONE) 200 MG tablet Take 100 mg by  mouth every other day.     Marland Kitchen aspirin 81 MG EC tablet Take 81 mg by mouth daily.      Marland Kitchen atorvastatin (LIPITOR) 40 MG tablet Take 40 mg by mouth at bedtime.     . calcium carbonate (OS-CAL) 600 MG TABS tablet Take 600 mg by mouth daily with breakfast.    . cetirizine (ZYRTEC) 10 MG tablet Take 10 mg by mouth daily.      Marland Kitchen esomeprazole (NEXIUM) 40 MG capsule Take 40 mg by mouth daily.      . valsartan-hydrochlorothiazide (DIOVAN-HCT) 320-25 MG per tablet Take 1 tablet by mouth daily.      No current facility-administered medications for this visit.    Allergies  Allergen Reactions  . Sulfonamide Derivatives     Review of Systems negative except from HPI and PMH  Physical Exam BP 145/76 mmHg  Pulse 58  Ht 5' 6.5" (1.689 m)  Wt 207 lb 8 oz (94.121 kg)  BMI 32.99 kg/m2 Well developed and well nourished in no acute distress HENT normal E scleral and icterus clear Neck Supple JVP flat; carotids brisk and full Clear to ausculation Regular rate and rhythm, no murmurs gallops or rub Soft with active bowel sounds No clubbing cyanosis Trace Edema Alert and oriented, grossly normal motor and sensory function Skin Warm and  Dry  ECG demonstrates sinus rhythm at 74 Intervals 18/08/41 Nonspecific T wave changes  Assessment and  Plan  PACs/PVCs  Amiodarone therapy-high risk  Chest pain-atypical  Near-syncope  Cardiomyopathy recovered  Hypertension  Palpitations are well controlled. She remains on low-dose amiodarone. Surveillance laboratories were normal 8/15.  She said blood work was normal when reviewed by Dr. Caryn Section.  We discussed the role of an implantable loop recorder as opposed to the external recorder given no detected events. At this juncture she would like to postpone that.  We also discussed follow-up. She would like to follow up primarily with Dr. Caryn Section and see [place] as needed  I will defer to him ongoing amiodarone surveillance laboratories with LFTs, TSH every  6 months and a chest x-ray yearly.  Blood pressure is reasonably controlled at this time.

## 2015-02-18 NOTE — Patient Instructions (Signed)
Your physician recommends that you schedule a follow-up appointment in:  As needed  Your physician recommends that you continue on your current medications as directed. Please refer to the Current Medication list given to you today.  

## 2015-02-21 ENCOUNTER — Other Ambulatory Visit: Payer: Self-pay

## 2015-02-21 DIAGNOSIS — R55 Syncope and collapse: Secondary | ICD-10-CM

## 2015-03-06 ENCOUNTER — Telehealth: Payer: Self-pay | Admitting: *Deleted

## 2015-03-06 NOTE — Telephone Encounter (Signed)
Informed patient that her holter showed normal sinus rhythm with pvc's  Patient verbalized understanding

## 2015-03-07 ENCOUNTER — Ambulatory Visit (INDEPENDENT_AMBULATORY_CARE_PROVIDER_SITE_OTHER): Payer: Medicare Other

## 2015-03-07 DIAGNOSIS — R55 Syncope and collapse: Secondary | ICD-10-CM

## 2015-03-12 ENCOUNTER — Ambulatory Visit: Payer: Self-pay | Admitting: Ophthalmology

## 2015-04-20 NOTE — Op Note (Signed)
PATIENT NAME:  Christina Underwood, Christina Underwood MR#:  [id number] DATE OF BIRTH:  [birth year]  DATE OF PROCEDURE:  01/30/2015  PREOPERATIVE DIAGNOSIS:  Nuclear sclerotic cataract, right eye.  POSTOPERATIVE DIAGNOSIS:  Nuclear sclerotic cataract, right eye.  DIAGNOSIS CODE: H25.11, nuclear sclerotic cataract.    PROCEDURE:  Phacoemulsification with posterior chamber intraocular lens right eye, model SN60WF  SURGEON:  Lyla Glassing, MD  INDICATIONS:  This is a 69 year old female with decreased vision in the right eye.  PROCEDURE:  The risks and benefits of cataract surgery were discussed at length with the patient, including bleeding, infection, retinal detachment, re-operation, diplopia, ptosis, loss of vision, and loss of the eye. Informed consent was obtained. On the day of surgery, several sets of preoperative medication were administered to the operative eye including 0.5% tetracaine,1% cyclopentolate, 10% phenylephrine, 0.5% ketorolac, 0.5% gatifloxacin, and 2% lidocaine .  The patient was taken to the operating room and sedated via IV sedation. Topical tetracaine was placed in the eye. The operative eye was prepped using a diluted 10% Betadine solution and then covered in sterile drapes leaving only the operative eye exposed. A Lieberman lid speculum was placed to provide exposure. Using 0.12 forceps and a sideport blade, a paracentesis was created. Then a mixture of BSS, preservative free lidocaine, and epinephrine was injected into the anterior chamber. Next, a 2.4 mm keratome blade was used to create a two-step full-thickness clear corneal incision temporally. The cystitome and Utrata forceps were used to create a continuous capsulorrhexis in the anterior lens capsule. BSS on a hydrodissection cannula was used to perform gentle hydrodissection. Phacoemulsification was then performed to remove the nucleus. Irrigation and aspiration was performed to remove the remaining cortical material. Provisc was injected  to fill the capsular bag and anterior chamber. A 22.0 diopter SN60WF intraocular lens was injected into the capsular bag. The Connor wand was used to rotate it into proper position in the capsular bag. Irrigation and aspiration was performed to remove the remaining Viscoelastic material from the eye. BSS on a 30-gauge cannula was used to hydrate the wound. An intracameral antibiotic was administered. The wounds were checked and found to be watertight. The lid speculum and drapes were carefully removed. Several drops of Vigamox were placed in the operative eye. The eye was covered with protective eyewear. The patient was taken to the recovery area in good condition. There were no complications.   ____________________________ Lyla Glassing, MD nm:bu D: 01/30/2015 12:05:37 ET T: 01/30/2015 16:01:33 ET JOB#: [id number]  cc: Lyla Glassing, MD, <Dictator> Lyla Glassing MD ELECTRONICALLY SIGNED 02/03/2015 8:53

## 2015-04-20 NOTE — Op Note (Signed)
PATIENT NAME:  Christina Underwood, Christina Underwood MR#:  [id number] DATE OF BIRTH:  1946-11-09  DATE OF PROCEDURE:  03/12/2015   PREOPERATIVE DIAGNOSIS: Vitreous hemorrhage, right eye.   POSTOPERATIVE DIAGNOSIS: Vitreous hemorrhage, right eye, retinal detachment, right eye.   PROCEDURE: A 25-gauge pars plana vitrectomy with anterior chamber washout, endolaser air-fluid exchange and 16% SF6.   ANESTHESIA: Monitored anesthesia care with retrobulbar block.   COMPLICATIONS: None.   ESTIMATED BLOOD LOSS: Minimal.   PROCEDURE NOTE: [name] was evaluated in the clinic for ananterior chamber and vitreous hemorrhage following a cataract surgery.  Conservative measures were taken initially; however, the vitreous hemorrhage was not resolving on its own. The risks, benefits, alternatives, and complications were discussed with the patient, and she elected to proceed with pars plana vitrectomy with AC washout.    On the day of service, the patient was greeted in the preoperative holding area. The consents were reviewed.  The right eye was marked. Any questions were answered. The patient was taken to the Operating Room in supine position and placed under monitored anesthesia care and retrobulbar block was performed. The right eye was then prepped and draped in the usual sterile fashion. Three 25-gauge trocars were placed in the usual positions.  A side-port blade was used to make a paracentesis superiorly and BSS on a cannula was used to irrigate the hyphema from the anterior chamber.  At this point, it was noted that there was blood inside the posterior capsule, in between the IOL and the wall of the capsule which was obscuring the view to the posterior pole along with vitreous hemorrhage as well; so the vitrector was used to make a posterior capsule opening.  The patient had a previous vitrectomy so there was predominantly anterior vitreous and the core vitreous had been removed. Any remaining gel and hemorrhage was removed to  the periphery.  A 360 scleral depressed exam revealed a retinal tear superiorly with a small amount of subretinal fluid.  This area was marked and barricaded by laser.  Air-fluid exchange was then performed and the subretinal fluid was drained.  The additional laser was placed 360 degrees in case of additional small tears in the remaining vitreous skirt.  The 16% SF6 at 4 times the vitreous volume was infused through the eye. The trocars were then removed. The pressure in the eye was acceptable by palpation.  The wounds were airtight.  Subconjunctival cefuroxime and dexamethasone was injected.  The eye was then patched and shielded with Neo-Poly-Dex ointment and the patient taken to the recovery area in stable condition.    ____________________________ Laban Emperor Oval Linsey, MD jdr:DT D: 03/13/2015 20:24:00 ET T: 03/14/2015 10:37:56 ET JOB#: [id number]  cc: Janett Billow D. Oval Linsey, MD, <Dictator> Laban Emperor [facility] MD ELECTRONICALLY SIGNED 03/15/2015 11:40

## 2015-04-23 ENCOUNTER — Other Ambulatory Visit: Payer: Self-pay

## 2015-04-23 DIAGNOSIS — Z1231 Encounter for screening mammogram for malignant neoplasm of breast: Secondary | ICD-10-CM

## 2015-06-16 ENCOUNTER — Other Ambulatory Visit: Payer: Self-pay

## 2015-08-11 ENCOUNTER — Other Ambulatory Visit: Payer: Self-pay | Admitting: Internal Medicine

## 2015-08-11 DIAGNOSIS — Z1231 Encounter for screening mammogram for malignant neoplasm of breast: Secondary | ICD-10-CM

## 2015-09-08 ENCOUNTER — Other Ambulatory Visit: Payer: Self-pay | Admitting: Internal Medicine

## 2015-09-08 ENCOUNTER — Ambulatory Visit
Admission: RE | Admit: 2015-09-08 | Discharge: 2015-09-08 | Disposition: A | Discharge status: Home / Self Care | Payer: Medicare Other | Source: Ambulatory Visit | Attending: Internal Medicine | Admitting: Internal Medicine

## 2015-09-08 DIAGNOSIS — Z1231 Encounter for screening mammogram for malignant neoplasm of breast: Secondary | ICD-10-CM

## 2015-10-22 DIAGNOSIS — M1711 Unilateral primary osteoarthritis, right knee: Secondary | ICD-10-CM | POA: Insufficient documentation

## 2016-02-16 ENCOUNTER — Other Ambulatory Visit
Admission: RE | Admit: 2016-02-16 | Discharge: 2016-02-16 | Disposition: A | Discharge status: Home / Self Care | Payer: Medicare HMO | Source: Ambulatory Visit | Attending: Ophthalmology | Admitting: Ophthalmology

## 2016-02-16 DIAGNOSIS — H2000 Unspecified acute and subacute iridocyclitis: Secondary | ICD-10-CM | POA: Insufficient documentation

## 2016-02-16 LAB — CBC WITH DIFFERENTIAL/PLATELET
BASOS ABS: 0.1 10*3/uL (ref 0–0.1)
BASOS PCT: 2 %
EOS ABS: 0.4 10*3/uL (ref 0–0.7)
EOS PCT: 6 %
HEMATOCRIT: 37.6 % (ref 35.0–47.0)
Hemoglobin: 13 g/dL (ref 12.0–16.0)
Lymphocytes Relative: 21 %
Lymphs Abs: 1.4 10*3/uL (ref 1.0–3.6)
MCH: 31.8 pg (ref 26.0–34.0)
MCHC: 34.7 g/dL (ref 32.0–36.0)
MCV: 91.5 fL (ref 80.0–100.0)
MONO ABS: 0.7 10*3/uL (ref 0.2–0.9)
MONOS PCT: 10 %
NEUTROS ABS: 4 10*3/uL (ref 1.4–6.5)
Neutrophils Relative %: 61 %
PLATELETS: 277 10*3/uL (ref 150–440)
RBC: 4.11 MIL/uL (ref 3.80–5.20)
RDW: 13.2 % (ref 11.5–14.5)
WBC: 6.6 10*3/uL (ref 3.6–11.0)

## 2016-02-16 LAB — RAPID HIV SCREEN (HIV 1/2 AB+AG)
HIV 1/2 ANTIBODIES: NONREACTIVE
HIV-1 P24 ANTIGEN - HIV24: NONREACTIVE

## 2016-02-16 LAB — SEDIMENTATION RATE: SED RATE: 17 mm/h (ref 0–30)

## 2016-02-18 LAB — ANGIOTENSIN CONVERTING ENZYME: Angiotensin-Converting Enzyme: 58 U/L (ref 14–82)

## 2016-02-19 LAB — QUANTIFERON IN TUBE
QFT TB AG MINUS NIL VALUE: <0 IU/mL
QUANTIFERON MITOGEN VALUE: 9.16 IU/mL
QUANTIFERON NIL VALUE: 0.03 [IU]/mL
QUANTIFERON TB AG VALUE: 0 IU/mL
QUANTIFERON TB GOLD: NEGATIVE

## 2016-02-19 LAB — QUANTIFERON TB GOLD ASSAY (BLOOD)

## 2016-02-19 LAB — ROCKY MTN SPOTTED FVR ABS PNL(IGG+IGM)
RMSF IGG: POSITIVE — AB
RMSF IgM: 0.17 index (ref 0.00–0.89)

## 2016-02-19 LAB — RMSF, IGG, IFA

## 2016-02-19 LAB — RPR: RPR Ser Ql: NONREACTIVE

## 2016-06-15 DIAGNOSIS — H35371 Puckering of macula, right eye: Secondary | ICD-10-CM | POA: Insufficient documentation

## 2016-06-15 DIAGNOSIS — Z961 Presence of intraocular lens: Secondary | ICD-10-CM | POA: Insufficient documentation

## 2016-06-15 DIAGNOSIS — H35033 Hypertensive retinopathy, bilateral: Secondary | ICD-10-CM | POA: Insufficient documentation

## 2016-06-15 DIAGNOSIS — H2512 Age-related nuclear cataract, left eye: Secondary | ICD-10-CM | POA: Insufficient documentation

## 2016-08-12 ENCOUNTER — Other Ambulatory Visit: Payer: Self-pay | Admitting: Internal Medicine

## 2016-08-12 DIAGNOSIS — Z1231 Encounter for screening mammogram for malignant neoplasm of breast: Secondary | ICD-10-CM

## 2016-09-08 ENCOUNTER — Other Ambulatory Visit: Payer: Self-pay | Admitting: Internal Medicine

## 2016-09-08 ENCOUNTER — Ambulatory Visit
Admission: RE | Admit: 2016-09-08 | Discharge: 2016-09-08 | Disposition: A | Discharge status: Home / Self Care | Payer: Medicare HMO | Source: Ambulatory Visit | Attending: Internal Medicine | Admitting: Internal Medicine

## 2016-09-08 DIAGNOSIS — Z1231 Encounter for screening mammogram for malignant neoplasm of breast: Secondary | ICD-10-CM

## 2016-11-03 DIAGNOSIS — E538 Deficiency of other specified B group vitamins: Secondary | ICD-10-CM | POA: Insufficient documentation

## 2016-12-30 DIAGNOSIS — G4733 Obstructive sleep apnea (adult) (pediatric): Secondary | ICD-10-CM | POA: Diagnosis not present

## 2016-12-30 DIAGNOSIS — F5101 Primary insomnia: Secondary | ICD-10-CM | POA: Diagnosis not present

## 2016-12-30 DIAGNOSIS — E669 Obesity, unspecified: Secondary | ICD-10-CM | POA: Diagnosis not present

## 2017-01-18 DIAGNOSIS — I480 Paroxysmal atrial fibrillation: Secondary | ICD-10-CM | POA: Diagnosis not present

## 2017-01-18 DIAGNOSIS — I10 Essential (primary) hypertension: Secondary | ICD-10-CM | POA: Diagnosis not present

## 2017-01-18 DIAGNOSIS — J111 Influenza due to unidentified influenza virus with other respiratory manifestations: Secondary | ICD-10-CM | POA: Diagnosis not present

## 2017-01-25 DIAGNOSIS — I10 Essential (primary) hypertension: Secondary | ICD-10-CM | POA: Diagnosis not present

## 2017-01-25 DIAGNOSIS — I480 Paroxysmal atrial fibrillation: Secondary | ICD-10-CM | POA: Diagnosis not present

## 2017-02-01 DIAGNOSIS — K219 Gastro-esophageal reflux disease without esophagitis: Secondary | ICD-10-CM | POA: Diagnosis not present

## 2017-02-01 DIAGNOSIS — Z79899 Other long term (current) drug therapy: Secondary | ICD-10-CM | POA: Diagnosis not present

## 2017-02-01 DIAGNOSIS — E538 Deficiency of other specified B group vitamins: Secondary | ICD-10-CM | POA: Diagnosis not present

## 2017-02-01 DIAGNOSIS — G4733 Obstructive sleep apnea (adult) (pediatric): Secondary | ICD-10-CM | POA: Diagnosis not present

## 2017-02-01 DIAGNOSIS — I4581 Long QT syndrome: Secondary | ICD-10-CM | POA: Diagnosis not present

## 2017-02-01 DIAGNOSIS — E785 Hyperlipidemia, unspecified: Secondary | ICD-10-CM | POA: Diagnosis not present

## 2017-02-01 DIAGNOSIS — I10 Essential (primary) hypertension: Secondary | ICD-10-CM | POA: Diagnosis not present

## 2017-02-01 DIAGNOSIS — I480 Paroxysmal atrial fibrillation: Secondary | ICD-10-CM | POA: Diagnosis not present

## 2017-02-01 DIAGNOSIS — F3342 Major depressive disorder, recurrent, in full remission: Secondary | ICD-10-CM | POA: Diagnosis not present

## 2017-02-01 DIAGNOSIS — Z0000 Encounter for general adult medical examination without abnormal findings: Secondary | ICD-10-CM | POA: Diagnosis not present

## 2017-02-01 DIAGNOSIS — Z0389 Encounter for observation for other suspected diseases and conditions ruled out: Secondary | ICD-10-CM | POA: Diagnosis not present

## 2017-03-29 DIAGNOSIS — L821 Other seborrheic keratosis: Secondary | ICD-10-CM | POA: Diagnosis not present

## 2017-03-29 DIAGNOSIS — L570 Actinic keratosis: Secondary | ICD-10-CM | POA: Diagnosis not present

## 2017-06-30 DIAGNOSIS — H2000 Unspecified acute and subacute iridocyclitis: Secondary | ICD-10-CM | POA: Diagnosis not present

## 2017-07-01 ENCOUNTER — Other Ambulatory Visit: Payer: Self-pay | Admitting: *Deleted

## 2017-07-01 NOTE — Patient Outreach (Signed)
06/29/17 first attempt-Telephone call to patient for follow up on [place]. Patient's husband answered telephone and states "she's not available". [phone] husband's cell phone) 06/30/17 second attempt-Telephone call to patient for follow up on [place]. No answer to telephone and no option to leave voicemail. [phone])  PLAN Attempt to reach pt next week  Jacqlyn Larsen [facility], [place] Coordinator [phone]

## 2017-07-06 ENCOUNTER — Other Ambulatory Visit: Payer: Self-pay | Admitting: *Deleted

## 2017-07-06 ENCOUNTER — Encounter: Payer: Self-pay | Admitting: *Deleted

## 2017-07-06 NOTE — Patient Outreach (Signed)
Telephone call to patient for follow up on Christina Underwood. No answer to telephone [phone] and no option to leave voicemail.  Rn CM mailed unsuccessful outreach letter to pt home and will keep case open for 10 days.  Jacqlyn Larsen [facility], [place] Coordinator [phone]

## 2017-07-14 DIAGNOSIS — H2000 Unspecified acute and subacute iridocyclitis: Secondary | ICD-10-CM | POA: Diagnosis not present

## 2017-07-18 ENCOUNTER — Other Ambulatory Visit: Payer: Self-pay | Admitting: *Deleted

## 2017-07-18 NOTE — Patient Outreach (Signed)
Pt did not respond to letter mailed to her home.  Case closed.  Jacqlyn Larsen [facility], [place] Coordinator [phone]

## 2017-07-27 DIAGNOSIS — I10 Essential (primary) hypertension: Secondary | ICD-10-CM | POA: Diagnosis not present

## 2017-07-27 DIAGNOSIS — E538 Deficiency of other specified B group vitamins: Secondary | ICD-10-CM | POA: Diagnosis not present

## 2017-08-03 ENCOUNTER — Other Ambulatory Visit: Payer: Self-pay | Admitting: Internal Medicine

## 2017-08-03 DIAGNOSIS — E538 Deficiency of other specified B group vitamins: Secondary | ICD-10-CM | POA: Diagnosis not present

## 2017-08-03 DIAGNOSIS — F3342 Major depressive disorder, recurrent, in full remission: Secondary | ICD-10-CM | POA: Diagnosis not present

## 2017-08-03 DIAGNOSIS — Z1231 Encounter for screening mammogram for malignant neoplasm of breast: Secondary | ICD-10-CM

## 2017-08-03 DIAGNOSIS — I4581 Long QT syndrome: Secondary | ICD-10-CM | POA: Diagnosis not present

## 2017-08-03 DIAGNOSIS — I480 Paroxysmal atrial fibrillation: Secondary | ICD-10-CM | POA: Diagnosis not present

## 2017-08-03 DIAGNOSIS — K219 Gastro-esophageal reflux disease without esophagitis: Secondary | ICD-10-CM | POA: Diagnosis not present

## 2017-08-03 DIAGNOSIS — E876 Hypokalemia: Secondary | ICD-10-CM | POA: Diagnosis not present

## 2017-08-03 DIAGNOSIS — E785 Hyperlipidemia, unspecified: Secondary | ICD-10-CM | POA: Diagnosis not present

## 2017-08-03 DIAGNOSIS — M1711 Unilateral primary osteoarthritis, right knee: Secondary | ICD-10-CM | POA: Diagnosis not present

## 2017-08-03 DIAGNOSIS — I10 Essential (primary) hypertension: Secondary | ICD-10-CM | POA: Diagnosis not present

## 2017-08-03 DIAGNOSIS — G4733 Obstructive sleep apnea (adult) (pediatric): Secondary | ICD-10-CM | POA: Diagnosis not present

## 2017-08-03 DIAGNOSIS — H35033 Hypertensive retinopathy, bilateral: Secondary | ICD-10-CM | POA: Diagnosis not present

## 2017-09-12 ENCOUNTER — Ambulatory Visit
Admission: RE | Admit: 2017-09-12 | Discharge: 2017-09-12 | Disposition: A | Discharge status: Home / Self Care | Payer: PPO | Source: Ambulatory Visit | Attending: Internal Medicine | Admitting: Internal Medicine

## 2017-09-12 DIAGNOSIS — Z1231 Encounter for screening mammogram for malignant neoplasm of breast: Secondary | ICD-10-CM | POA: Diagnosis not present

## 2017-11-16 DIAGNOSIS — H2512 Age-related nuclear cataract, left eye: Secondary | ICD-10-CM | POA: Diagnosis not present

## 2018-01-30 DIAGNOSIS — E538 Deficiency of other specified B group vitamins: Secondary | ICD-10-CM | POA: Diagnosis not present

## 2018-01-30 DIAGNOSIS — E785 Hyperlipidemia, unspecified: Secondary | ICD-10-CM | POA: Diagnosis not present

## 2018-01-30 DIAGNOSIS — I10 Essential (primary) hypertension: Secondary | ICD-10-CM | POA: Diagnosis not present

## 2018-02-06 DIAGNOSIS — H35033 Hypertensive retinopathy, bilateral: Secondary | ICD-10-CM | POA: Diagnosis not present

## 2018-02-06 DIAGNOSIS — G4733 Obstructive sleep apnea (adult) (pediatric): Secondary | ICD-10-CM | POA: Diagnosis not present

## 2018-02-06 DIAGNOSIS — I4581 Long QT syndrome: Secondary | ICD-10-CM | POA: Diagnosis not present

## 2018-02-06 DIAGNOSIS — I480 Paroxysmal atrial fibrillation: Secondary | ICD-10-CM | POA: Diagnosis not present

## 2018-02-06 DIAGNOSIS — M1711 Unilateral primary osteoarthritis, right knee: Secondary | ICD-10-CM | POA: Diagnosis not present

## 2018-02-06 DIAGNOSIS — K219 Gastro-esophageal reflux disease without esophagitis: Secondary | ICD-10-CM | POA: Diagnosis not present

## 2018-02-06 DIAGNOSIS — I10 Essential (primary) hypertension: Secondary | ICD-10-CM | POA: Diagnosis not present

## 2018-02-06 DIAGNOSIS — R3129 Other microscopic hematuria: Secondary | ICD-10-CM | POA: Diagnosis not present

## 2018-02-06 DIAGNOSIS — E538 Deficiency of other specified B group vitamins: Secondary | ICD-10-CM | POA: Diagnosis not present

## 2018-02-06 DIAGNOSIS — F3342 Major depressive disorder, recurrent, in full remission: Secondary | ICD-10-CM | POA: Diagnosis not present

## 2018-02-06 DIAGNOSIS — Z0000 Encounter for general adult medical examination without abnormal findings: Secondary | ICD-10-CM | POA: Diagnosis not present

## 2018-02-06 DIAGNOSIS — E785 Hyperlipidemia, unspecified: Secondary | ICD-10-CM | POA: Diagnosis not present

## 2018-02-09 ENCOUNTER — Ambulatory Visit: Payer: PPO | Admitting: Internal Medicine

## 2018-02-09 ENCOUNTER — Encounter: Payer: Self-pay | Admitting: Internal Medicine

## 2018-02-09 VITALS — BP 100/50 | HR 62 | Ht 64.0 in | Wt 196.5 lb

## 2018-02-09 DIAGNOSIS — I493 Ventricular premature depolarization: Secondary | ICD-10-CM | POA: Diagnosis not present

## 2018-02-09 DIAGNOSIS — Z79899 Other long term (current) drug therapy: Secondary | ICD-10-CM

## 2018-02-09 DIAGNOSIS — I491 Atrial premature depolarization: Secondary | ICD-10-CM | POA: Diagnosis not present

## 2018-02-09 DIAGNOSIS — I480 Paroxysmal atrial fibrillation: Secondary | ICD-10-CM | POA: Diagnosis not present

## 2018-02-09 NOTE — Progress Notes (Signed)
Patient Care Team: Adin Hector, MD as PCP - General (Internal Medicine)   HPI  Christina Underwood is a 72 y.o. female Seen in followup after a hiatus of about 2 years. She has symptomatic PACs and PVCs. She was treated with amiodarone .she has been on low-dose therapy for some years, taking 100 mg 5 days a week. Surveillance laboratories have been followed by Dr. Roney Marion.    DATE TEST EF   2008 Cath  No CAD  1/16 Myoview  75 % No ischemia         Date TSH LFT  2/19 2.92 15        Has hx of syncope; Rx sleep apnea  Dr Katherene Ponto notes refer to "PAF"   Scant palps   Patient denies symptoms of GI intolerance, sun sensitivity, neurological symptoms attributable to amiodarone.  Surveillance laboratories were in normal limits when checked 1 months  Ago  The patient denies chest pain, shortness of breath, nocturnal dyspnea, orthopnea or peripheral edema.  There have been no lightheadedness or syncope.      Past Medical History:  Diagnosis Date  . Abdominal discomfort    R upper quad  . Atypical chest pain   . Breast lump   . CAD (coronary artery disease)    25% RCA 2008   . Depression   . GERD (gastroesophageal reflux disease)   . HLD (hyperlipidemia)   . HTN (hypertension)   . Hypokalemia   . Long QT interval    assoc with amiodarone  . PAC (premature atrial contraction)   . Right ventricular outflow tract premature ventricular contractions (PVCs)   . Sleep apnea     Past Surgical History:  Procedure Laterality Date  . APPENDECTOMY    . bunion repairs     bilateral   . CARDIAC CATHETERIZATION     [facility]  . CARDIAC CATHETERIZATION     [facility].  Marland Kitchen CATARACT EXTRACTION Right   . COLONOSCOPY    . lumbar laminextomy  2001  . TOTAL ABDOMINAL HYSTERECTOMY W/ BILATERAL SALPINGOOPHORECTOMY    . VESICOVAGINAL FISTULA CLOSURE W/ TAH      Current Outpatient Medications  Medication Sig Dispense Refill  . amiodarone (PACERONE) 200 MG tablet Take 100 mg by  mouth every other day.     Marland Kitchen aspirin 81 MG EC tablet Take 81 mg by mouth daily.      Marland Kitchen atorvastatin (LIPITOR) 40 MG tablet Take 40 mg by mouth at bedtime.     . calcium carbonate (OS-CAL) 600 MG TABS tablet Take 600 mg by mouth daily with breakfast.    . cetirizine (ZYRTEC) 10 MG tablet Take 10 mg by mouth daily.      . citalopram (CELEXA) 10 MG tablet Take 10 mg by mouth daily.    Marland Kitchen esomeprazole (NEXIUM) 40 MG capsule Take 40 mg by mouth daily.      . pantoprazole (PROTONIX) 40 MG tablet TAKE 1 TABLET(40 MG) BY MOUTH EVERY DAY    . valsartan-hydrochlorothiazide (DIOVAN-HCT) 320-25 MG per tablet Take 1 tablet by mouth daily.      No current facility-administered medications for this visit.     Allergies  Allergen Reactions  . Sulfonamide Derivatives   . Sulfa Antibiotics Itching and Rash    Review of Systems negative except from HPI and PMH  Physical Exam BP (!) 100/50 (BP Location: Left Arm, Patient Position: Sitting, Cuff Size: Normal)   Pulse 62   Ht 5\' 4"  (  1.626 m)   Wt 196 lb 8 oz (89.1 kg)   BMI 33.73 kg/m  Well developed and nourished in no acute distress HENT normal Neck supple with JVP-flat Clear Regular rate and rhythm, no murmurs or gallops Abd-soft with active BS No Clubbing cyanosis edema Skin-warm and dry A & Oriented  Grossly normal sensory and motor function  ECG demonstrates Sinus @ 62 19/07/53 PRWP   Assessment and  Plan  PACs/PVCs  Amiodarone therapy-high risk  Cardiomyopathy recovered  Hypertension   palps are quiet  Tolerating amio and surveillance labs are normal  BP well controlled

## 2018-02-09 NOTE — Patient Instructions (Signed)
Medication Instructions: - Your physician recommends that you continue on your current medications as directed. Please refer to the Current Medication list given to you today.  Labwork: - none ordered  Procedures/Testing: - none ordered  Follow-Up: - Your physician wants you to follow-up in: 2 years with Dr. Caryl Comes. You will receive a reminder letter in the mail two months in advance. If you don't receive a letter, please call our office to schedule the follow-up appointment.   Any Additional Special Instructions Will Be Listed Below (If Applicable).     If you need a refill on your cardiac medications before your next appointment, please call your pharmacy.

## 2018-06-23 DIAGNOSIS — I480 Paroxysmal atrial fibrillation: Secondary | ICD-10-CM | POA: Diagnosis not present

## 2018-06-23 DIAGNOSIS — J40 Bronchitis, not specified as acute or chronic: Secondary | ICD-10-CM | POA: Diagnosis not present

## 2018-07-31 DIAGNOSIS — G4733 Obstructive sleep apnea (adult) (pediatric): Secondary | ICD-10-CM | POA: Diagnosis not present

## 2018-07-31 DIAGNOSIS — I480 Paroxysmal atrial fibrillation: Secondary | ICD-10-CM | POA: Diagnosis not present

## 2018-07-31 DIAGNOSIS — E538 Deficiency of other specified B group vitamins: Secondary | ICD-10-CM | POA: Diagnosis not present

## 2018-07-31 DIAGNOSIS — I10 Essential (primary) hypertension: Secondary | ICD-10-CM | POA: Diagnosis not present

## 2018-07-31 DIAGNOSIS — E785 Hyperlipidemia, unspecified: Secondary | ICD-10-CM | POA: Diagnosis not present

## 2018-07-31 DIAGNOSIS — K219 Gastro-esophageal reflux disease without esophagitis: Secondary | ICD-10-CM | POA: Diagnosis not present

## 2018-08-07 DIAGNOSIS — G4733 Obstructive sleep apnea (adult) (pediatric): Secondary | ICD-10-CM | POA: Diagnosis not present

## 2018-08-07 DIAGNOSIS — E538 Deficiency of other specified B group vitamins: Secondary | ICD-10-CM | POA: Diagnosis not present

## 2018-08-07 DIAGNOSIS — I480 Paroxysmal atrial fibrillation: Secondary | ICD-10-CM | POA: Diagnosis not present

## 2018-08-07 DIAGNOSIS — F3342 Major depressive disorder, recurrent, in full remission: Secondary | ICD-10-CM | POA: Diagnosis not present

## 2018-08-07 DIAGNOSIS — E785 Hyperlipidemia, unspecified: Secondary | ICD-10-CM | POA: Diagnosis not present

## 2018-08-07 DIAGNOSIS — K219 Gastro-esophageal reflux disease without esophagitis: Secondary | ICD-10-CM | POA: Diagnosis not present

## 2018-08-07 DIAGNOSIS — I10 Essential (primary) hypertension: Secondary | ICD-10-CM | POA: Diagnosis not present

## 2018-08-07 DIAGNOSIS — I4581 Long QT syndrome: Secondary | ICD-10-CM | POA: Diagnosis not present

## 2018-08-09 ENCOUNTER — Other Ambulatory Visit: Payer: Self-pay | Admitting: Internal Medicine

## 2018-08-09 DIAGNOSIS — Z1231 Encounter for screening mammogram for malignant neoplasm of breast: Secondary | ICD-10-CM

## 2018-08-15 DIAGNOSIS — H01001 Unspecified blepharitis right upper eyelid: Secondary | ICD-10-CM | POA: Diagnosis not present

## 2018-08-22 ENCOUNTER — Telehealth: Payer: Self-pay | Admitting: *Deleted

## 2018-08-22 NOTE — Telephone Encounter (Signed)
Discussed further with Dr. Caryl Comes which type of monitor he would prefer the patient to have as her symptoms are reported to be infrequent.  Dr. Caryl Comes called the patient directly and the patient did confirm her episodes are infrequent.  MD advised the patient that dependent on her symptoms, the options are for a 2 week monitor (ZIO), 30 day monitor ([employer]), or possible LINQ implant.  Per Dr. Caryl Comes, the patient has decided to just observe her symptoms for now and call us if she desires to wear a monitor.

## 2018-08-22 NOTE — Telephone Encounter (Signed)
-----   Message from Deboraha Sprang, MD sent at 08/21/2018 12:54 PM EDT ----- Eustace Moore  Would be glad to set her up  Thanks  ----- Message ----- From: Adin Hector, MD Sent: 08/07/2018   8:51 AM EDT To: Deboraha Sprang, MD  Richardson Landry,  I saw Ms. Rasheed in the office today; she says she's having periodic spells of feeling "washed out"; they only last a few seconds, but she feels like her heartrate is irregular when it happens.  Last occurred about 2 weeks ago, and pretty infrequent, so sounds like she needs an event monitor. Do you want to set that up?  I think you guys have better event monitors than we do, in terms of patient tolerance.  I had her resume her ASA for now, pending documentation of ongoing afib.  Thank you!  [facility]

## 2018-08-23 DIAGNOSIS — H5200 Hypermetropia, unspecified eye: Secondary | ICD-10-CM | POA: Diagnosis not present

## 2018-08-29 DIAGNOSIS — H33011 Retinal detachment with single break, right eye: Secondary | ICD-10-CM | POA: Diagnosis not present

## 2018-08-29 DIAGNOSIS — H2000 Unspecified acute and subacute iridocyclitis: Secondary | ICD-10-CM | POA: Diagnosis not present

## 2018-09-12 DIAGNOSIS — H2000 Unspecified acute and subacute iridocyclitis: Secondary | ICD-10-CM | POA: Diagnosis not present

## 2018-09-13 ENCOUNTER — Ambulatory Visit
Admission: RE | Admit: 2018-09-13 | Discharge: 2018-09-13 | Disposition: A | Discharge status: Home / Self Care | Payer: PPO | Source: Ambulatory Visit | Attending: Internal Medicine | Admitting: Internal Medicine

## 2018-09-13 DIAGNOSIS — Z1231 Encounter for screening mammogram for malignant neoplasm of breast: Secondary | ICD-10-CM | POA: Insufficient documentation

## 2018-09-26 ENCOUNTER — Telehealth: Payer: Self-pay | Admitting: Internal Medicine

## 2018-09-26 NOTE — Telephone Encounter (Signed)
Pt c/o BP issue: STAT if pt c/o blurred vision, one-sided weakness or slurred speech  1. What are your last 5 BP readings? BP now 178/108  HR 50   2. Are you having any other symptoms (ex. Dizziness, headache, blurred vision, passed out)? Lightheaded chest pain   3. What is your BP issue?  Elevated and irregular HR

## 2018-09-26 NOTE — Telephone Encounter (Signed)
Patient c/o Palpitations:  High priority if patient c/o lightheadedness, shortness of breath, or chest pain  1) How long have you had palpitations/irregular HR/ Afib? Are you having the symptoms now? Several years, but has gotten worse in the last 3-4 weeks. Medications seems to make it worse. Irregular no.   2) Are you currently experiencing lightheadedness, SOB or CP? no  3) Do you have a history of afib (atrial fibrillation) or irregular heart rhythm? Yes.   4) Have you checked your BP or HR? (document readings if available): this morning 123/79 HR 60, Sunday after medication 157/81 HR 45  5) Are you experiencing any other symptoms? Some chest pain, not today.

## 2018-09-26 NOTE — Telephone Encounter (Signed)
Called patient. She states she is now having the symptoms everyday. She reports checking her pulse and feeling like her heart is skipping beats. Her heart rate as been as low as 47. She feels weak and tired and no energy. She talked with Dr Caryl Comes on 08/22/18 concerning her symptoms and heart monitor was discussed. Patient is agreeable to wear the monitor. Advised her I will route to Dr Caryl Comes for guidance on which monitor to order for patient.

## 2018-09-27 NOTE — Telephone Encounter (Signed)
No answer. Left message to call back.   

## 2018-09-27 NOTE — Telephone Encounter (Signed)
Patient called back and her BP/HR this morning is 134/72, HR 59.  She says yesterday evening, she had light chest pain and irregular heart rate for several hours.  This morning she does not have that discomfort.  Ok to add on to appointment tomorrow per Nira Conn.  Patient agreeable. Pt verbalized understanding to call 911 or go to the emergency room, if he develops any new or worsening symptoms.

## 2018-09-28 ENCOUNTER — Other Ambulatory Visit
Admission: RE | Admit: 2018-09-28 | Discharge: 2018-09-28 | Disposition: A | Discharge status: Home / Self Care | Payer: PPO | Source: Ambulatory Visit | Attending: Internal Medicine | Admitting: Internal Medicine

## 2018-09-28 ENCOUNTER — Ambulatory Visit: Payer: PPO | Admitting: Internal Medicine

## 2018-09-28 ENCOUNTER — Encounter: Payer: Self-pay | Admitting: Internal Medicine

## 2018-09-28 VITALS — BP 134/64 | HR 64 | Ht 64.0 in | Wt 197.0 lb

## 2018-09-28 DIAGNOSIS — R079 Chest pain, unspecified: Secondary | ICD-10-CM

## 2018-09-28 DIAGNOSIS — Z79899 Other long term (current) drug therapy: Secondary | ICD-10-CM

## 2018-09-28 DIAGNOSIS — I493 Ventricular premature depolarization: Secondary | ICD-10-CM | POA: Diagnosis not present

## 2018-09-28 DIAGNOSIS — I491 Atrial premature depolarization: Secondary | ICD-10-CM

## 2018-09-28 LAB — TROPONIN I

## 2018-09-28 MED ORDER — AMLODIPINE BESYLATE 5 MG PO TABS: ORAL_TABLET | ORAL | 3 refills | Status: DC

## 2018-09-28 MED ORDER — AMIODARONE HCL 200 MG PO TABS: ORAL_TABLET | ORAL | 1 refills | Status: DC

## 2018-09-28 NOTE — Progress Notes (Signed)
Patient Care Team: Adin Hector, MD as PCP - General (Internal Medicine)   HPI  Christina Underwood is a 72 y.o. female Seen as an addon today because of chest pain  This has been increasingly frequent over last month, with most recent spell 2 d ago, lasting 6 + hrs occurring without exercise.  Some radiation, no diaphoresis  Has hx of GERD   She has symptomatic PACs and PVCs. She was treated with amiodarone .she has been on low-dose therapy for some years, taking 100 mg 3 days a week. Surveillance laboratories have been followed by Dr. Roney Marion.  She has had increasingly freq PACs   BP at home 170-200 sys  DATE TEST EF   2008 Cath  No CAD  1/16 Myoview  75 % No ischemia         Date TSH LFT  2/19 2.92 15  8/19  2.72 17   Has hx of syncope; Rx sleep apnea           Past Medical History:  Diagnosis Date  . Abdominal discomfort    R upper quad  . Atypical chest pain   . Breast lump   . CAD (coronary artery disease)    25% RCA 2008   . Depression   . GERD (gastroesophageal reflux disease)   . HLD (hyperlipidemia)   . HTN (hypertension)   . Hypokalemia   . Long QT interval    assoc with amiodarone  . PAC (premature atrial contraction)   . Right ventricular outflow tract premature ventricular contractions (PVCs)   . Sleep apnea     Past Surgical History:  Procedure Laterality Date  . APPENDECTOMY    . bunion repairs     bilateral   . CARDIAC CATHETERIZATION     [facility]  . CARDIAC CATHETERIZATION     [facility].  Marland Kitchen CATARACT EXTRACTION Right   . COLONOSCOPY    . lumbar laminextomy  2001  . TOTAL ABDOMINAL HYSTERECTOMY W/ BILATERAL SALPINGOOPHORECTOMY    . VESICOVAGINAL FISTULA CLOSURE W/ TAH      Current Outpatient Medications  Medication Sig Dispense Refill  . amiodarone (PACERONE) 200 MG tablet Take 100 mg by mouth every other day.     Marland Kitchen aspirin 81 MG EC tablet Take 81 mg by mouth daily.      Marland Kitchen atorvastatin (LIPITOR) 40 MG tablet Take 40  mg by mouth at bedtime.     . calcium carbonate (OS-CAL) 600 MG TABS tablet Take 600 mg by mouth daily with breakfast.    . cetirizine (ZYRTEC) 10 MG tablet Take 10 mg by mouth daily.      . citalopram (CELEXA) 10 MG tablet Take 10 mg by mouth daily.    Marland Kitchen esomeprazole (NEXIUM) 40 MG capsule Take 40 mg by mouth daily.      . pantoprazole (PROTONIX) 40 MG tablet TAKE 1 TABLET(40 MG) BY MOUTH EVERY DAY    . valsartan-hydrochlorothiazide (DIOVAN-HCT) 320-25 MG per tablet Take 1 tablet by mouth daily.      No current facility-administered medications for this visit.     Allergies  Allergen Reactions  . Sulfonamide Derivatives   . Sulfa Antibiotics Itching and Rash    Review of Systems negative except from HPI and PMH  Physical Exam BP 134/64   Pulse 64   Ht 5\' 4"  (1.626 m)   Wt 197 lb (89.4 kg)   BMI 33.81 kg/m  Well developed and nourished in no acute  distress HENT normal Neck supple with JVP-flat Clear Regular rate and rhythm, no murmurs or gallops Abd-soft with active BS No Clubbing cyanosis edema Skin-warm and dry A & Oriented  Grossly normal sensory and motor function  ECG demonstrates sinus @ 64 Frequent PACs. Intervals 19/06/42  Assessment and  Plan  PACs/PVCs  Amiodarone therapy-high risk  Cardiomyopathy recovered  Hypertension  Chest Pain     Increased problems with palpitations; we will increase her amiodarone from 300 mg a week--200 mg twice daily x2 weeks and 200 mg a day.  Surveillance laboratories were normal last month.  No symptoms of amiodarone intolerance.  Blood pressure is an issue.  I am concerned about her tendency towards bradycardia.  We will try amlodipine.  Side effects reviewed.  We will start her 2.5 mg a day and her blood pressure remains over 150 following 2 weeks, she will increase it to 5 mg a day.  Chest discomfort is concerning although I suspect is noncardiac.  We will check a troponin today given the prolonged duration of the  episode 48 hours ago.  If this is abnormal, she will need catheterization otherwise we will undertake Myoview scanning and compared to the prior test.  More than 50% of 40 min was spent in counseling related to the above

## 2018-09-28 NOTE — Patient Instructions (Addendum)
Medication Instructions:  - Your physician has recommended you make the following change in your medication:   1) INCREASE amiodarone 200 mg: - take 1 tablet (200 mg) by mouth TWICE a day x 2 weeks, then - take 1 tablet (200 mg) by mouth ONCE daily  2) START amlodipine 5 mg - take 1/2 tablet (2.5 mg) by mouth once daily x 2 weeks, if the top # on your blood pressure is still running 140/ greater, then - take 1 tablet (5 mg) by mouth once daily  If you need a refill on your cardiac medications before your next appointment, please call your pharmacy.   Lab work: - Your physician recommends that you have lab work today: Troponin- please go to the [place] at [facility] to have this done at the hospital lab today  If you have labs (blood work) drawn today and your tests are completely normal, you will receive your results only by: Marland Kitchen MyChart Message (if you have MyChart) OR . A paper copy in the mail If you have any lab test that is abnormal or we need to change your treatment, we will call you to review the results.  Testing/Procedures: - Your physician has requested that you have an exercise stress myoview.  [place]  Your caregiver has ordered a Stress Test with nuclear imaging. The purpose of this test is to evaluate the blood supply to your heart muscle. This procedure is referred to as a "Non-Invasive Stress Test." This is because other than having an IV started in your vein, nothing is inserted or "invades" your body. Cardiac stress tests are done to find areas of poor blood flow to the heart by determining the extent of coronary artery disease (CAD). Some patients exercise on a treadmill, which naturally increases the blood flow to your heart, while others who are  unable to walk on a treadmill due to physical limitations have a pharmacologic/chemical stress agent called Lexiscan . This medicine will mimic walking on a treadmill by temporarily increasing your coronary blood flow.    Please note: these test may take anywhere between 2-4 hours to complete  PLEASE REPORT TO [facility] AT THE FIRST DESK WILL DIRECT YOU WHERE TO GO  Date of Procedure:_____________________________________  Arrival Time for Procedure:______________________________  Instructions regarding medication:   _x___ : You may take all of your regular medications the morning of your test with enough water to get them down safely  PLEASE NOTIFY THE OFFICE AT LEAST 24 HOURS IN ADVANCE IF YOU ARE UNABLE TO [place].  [phone] AND  PLEASE NOTIFY NUCLEAR MEDICINE AT [facility] AT LEAST 24 HOURS IN ADVANCE IF YOU ARE UNABLE TO KEEP YOUR APPOINTMENT. [phone]  How to prepare for your Myoview test:  1. Do not eat or drink after midnight 2. No caffeine for 24 hours prior to test 3. No smoking 24 hours prior to test. 4. Your medication may be taken with water.  If your doctor stopped a medication because of this test, do not take that medication. 5. Ladies, please do not wear dresses.  Skirts or pants are appropriate. Please wear a short sleeve shirt. 6. No perfume, cologne or lotion. 7. Wear comfortable walking shoes. No heels!  Follow-Up: At [facility], you and your health needs are our priority.  As part of our continuing mission to provide you with exceptional heart care, we have created designated Provider Care Teams.  These Care Teams include your primary  Cardiologist (physician) and Advanced Practice Providers (APPs -  Physician Assistants and Nurse Practitioners) who all work together to provide you with the care you need, when you need it. . 3 months with Dr. Caryl Comes  Any Other Special Instructions Will Be Listed Below (If Applicable). - N/A  Cardiac Nuclear Scan A cardiac nuclear scan is a test that measures blood flow to the heart when a person is resting and when he or she is exercising. The test looks for problems such as:  Not enough  blood reaching a portion of the heart.  The heart muscle not working normally.  You may need this test if:  You have heart disease.  You have had abnormal lab results.  You have had heart surgery or angioplasty.  You have chest pain.  You have shortness of breath.  In this test, a radioactive dye (tracer) is injected into your bloodstream. After the tracer has traveled to your heart, an imaging device is used to measure how much of the tracer is absorbed by or distributed to various areas of your heart. This procedure is usually done at a hospital and takes 2-4 hours. Tell a health care provider about:  Any allergies you have.  All medicines you are taking, including vitamins, herbs, eye drops, creams, and over-the-counter medicines.  Any problems you or family members have had with the use of anesthetic medicines.  Any blood disorders you have.  Any surgeries you have had.  Any medical conditions you have.  Whether you are pregnant or may be pregnant. What are the risks? Generally, this is a safe procedure. However, problems may occur, including:  Serious chest pain and heart attack. This is only a risk if the stress portion of the test is done.  Rapid heartbeat.  Sensation of warmth in your chest. This usually passes quickly.  What happens before the procedure?  Ask your health care provider about changing or stopping your regular medicines. This is especially important if you are taking diabetes medicines or blood thinners.  Remove your jewelry on the day of the procedure. What happens during the procedure?  An IV tube will be inserted into one of your veins.  Your health care provider will inject a small amount of radioactive tracer through the tube.  You will wait for 20-40 minutes while the tracer travels through your bloodstream.  Your heart activity will be monitored with an electrocardiogram (ECG).  You will lie down on an exam table.  Images of your  heart will be taken for about 15-20 minutes.  You may be asked to exercise on a treadmill or stationary bike. While you exercise, your heart's activity will be monitored with an ECG, and your blood pressure will be checked. If you are unable to exercise, you may be given a medicine to increase blood flow to parts of your heart.  When blood flow to your heart has peaked, a tracer will again be injected through the IV tube.  After 20-40 minutes, you will get back on the exam table and have more images taken of your heart.  When the procedure is over, your IV tube will be removed. The procedure may vary among health care providers and hospitals. Depending on the type of tracer used, scans may need to be repeated 3-4 hours later. What happens after the procedure?  Unless your health care provider tells you otherwise, you may return to your normal schedule, including diet, activities, and medicines.  Unless your health care provider  tells you otherwise, you may increase your fluid intake. This will help flush the contrast dye from your body. Drink enough fluid to keep your urine clear or pale yellow.  It is up to you to get your test results. Ask your health care provider, or the department that is doing the test, when your results will be ready. Summary  A cardiac nuclear scan measures the blood flow to the heart when a person is resting and when he or she is exercising.  You may need this test if you are at risk for heart disease.  Tell your health care provider if you are pregnant.  Unless your health care provider tells you otherwise, increase your fluid intake. This will help flush the contrast dye from your body. Drink enough fluid to keep your urine clear or pale yellow. This information is not intended to replace advice given to you by your health care provider. Make sure you discuss any questions you have with your health care provider. Document Released: 12/31/2004 Document Revised:  12/08/2016 Document Reviewed: 11/14/2013 Elsevier Interactive Patient Education  2017 [employer].

## 2018-10-10 ENCOUNTER — Ambulatory Visit
Admission: RE | Admit: 2018-10-10 | Discharge: 2018-10-10 | Disposition: A | Discharge status: Home / Self Care | Payer: PPO | Source: Ambulatory Visit | Attending: Internal Medicine | Admitting: Internal Medicine

## 2018-10-10 DIAGNOSIS — R079 Chest pain, unspecified: Secondary | ICD-10-CM | POA: Insufficient documentation

## 2018-10-10 LAB — NM MYOCAR MULTI W/SPECT W/WALL MOTION / EF
CHL CUP RESTING HR STRESS: 54 {beats}/min
CSEPEDS: 52 s
CSEPPHR: 131 {beats}/min
Estimated workload: 6.5 METS
Exercise duration (min): 4 min
LV dias vol: 53 mL (ref 46–106)
LVSYSVOL: 15 mL
Percent HR: 87 %
TID: 1

## 2018-10-10 MED ORDER — TECHNETIUM TC 99M TETROFOSMIN IV KIT
10.7100 | PACK | Freq: Once | INTRAVENOUS | Status: AC | PRN
  Administered 2018-10-10: 10.71 via INTRAVENOUS

## 2018-10-10 MED ORDER — TECHNETIUM TC 99M TETROFOSMIN IV KIT
33.3000 | PACK | Freq: Once | INTRAVENOUS | Status: AC | PRN
  Administered 2018-10-10: 33.3 via INTRAVENOUS

## 2018-10-16 DIAGNOSIS — H2000 Unspecified acute and subacute iridocyclitis: Secondary | ICD-10-CM | POA: Diagnosis not present

## 2018-10-26 DIAGNOSIS — H01001 Unspecified blepharitis right upper eyelid: Secondary | ICD-10-CM | POA: Diagnosis not present

## 2018-10-26 DIAGNOSIS — H5789 Other specified disorders of eye and adnexa: Secondary | ICD-10-CM | POA: Diagnosis not present

## 2018-12-24 ENCOUNTER — Other Ambulatory Visit: Payer: Self-pay | Admitting: Internal Medicine

## 2018-12-26 ENCOUNTER — Other Ambulatory Visit: Payer: Self-pay | Admitting: Internal Medicine

## 2018-12-28 ENCOUNTER — Ambulatory Visit: Payer: PPO | Admitting: Internal Medicine

## 2018-12-28 ENCOUNTER — Encounter: Payer: Self-pay | Admitting: Internal Medicine

## 2018-12-28 VITALS — BP 140/60 | HR 58 | Ht 64.0 in | Wt 200.0 lb

## 2018-12-28 DIAGNOSIS — I491 Atrial premature depolarization: Secondary | ICD-10-CM

## 2018-12-28 DIAGNOSIS — Z79899 Other long term (current) drug therapy: Secondary | ICD-10-CM

## 2018-12-28 DIAGNOSIS — I493 Ventricular premature depolarization: Secondary | ICD-10-CM

## 2018-12-28 NOTE — Progress Notes (Signed)
Patient Care Team: Adin Hector, MD as PCP - General (Internal Medicine)   HPI  Christina Underwood is a 73 y.o. female In follow-up of chest pain.She has symptomatic PACs and PVCs. She was treated with amiodarone .she has been on low-dose therapy for some years, taking 100 mg 3 days a week. Surveillance laboratories have been followed by Dr. Roney Marion.  Seen for chest pain a few months ago with progressive symptoms.  Noted to have significant elevated blood pressures at home.  Troponin was negative despite prolonged discomfort  Has hx of GERD    She has been feeling much better.  Scant CP, no limiting dyspnea. No edema, PND or orthopnea Scant palpitations  BP much better controlled on amlodipine with home BP 120-130s  DATE TEST EF   2008 Cath  No CAD  1/16 Myoview  75 % No ischemia  10/19 Myoview 75% No Ischemia I    Date TSH LFT  2/19 2.92 15  8/19  2.72 17   Patient denies symptoms of GI intolerance, sun sensitivity, neurological symptoms attributable to amiodarone.     Has hx of syncope; Rx sleep apnea           Past Medical History:  Diagnosis Date  . Abdominal discomfort    R upper quad  . Atypical chest pain   . Breast lump   . CAD (coronary artery disease)    25% RCA 2008   . Depression   . GERD (gastroesophageal reflux disease)   . HLD (hyperlipidemia)   . HTN (hypertension)   . Hypokalemia   . Long QT interval    assoc with amiodarone  . PAC (premature atrial contraction)   . Right ventricular outflow tract premature ventricular contractions (PVCs)   . Sleep apnea     Past Surgical History:  Procedure Laterality Date  . APPENDECTOMY    . bunion repairs     bilateral   . CARDIAC CATHETERIZATION     [facility]  . CARDIAC CATHETERIZATION     [facility].  Marland Kitchen CATARACT EXTRACTION Right   . COLONOSCOPY    . lumbar laminextomy  2001  . TOTAL ABDOMINAL HYSTERECTOMY W/ BILATERAL SALPINGOOPHORECTOMY    . VESICOVAGINAL FISTULA CLOSURE W/ TAH       Current Outpatient Medications  Medication Sig Dispense Refill  . amiodarone (PACERONE) 200 MG tablet TAKE 1 TABLET(200 MG) BY MOUTH TWICE DAILY FOR 2 WEEKS, THEN 1 TABLET(200 MG) ONCE DAILY (Patient taking differently: Take 200 mg by mouth daily. ) 60 tablet 5  . amLODipine (NORVASC) 5 MG tablet Take 1 tablet (5 mg total) by mouth daily. 90 tablet 2  . aspirin 81 MG EC tablet Take 81 mg by mouth daily.      Marland Kitchen atorvastatin (LIPITOR) 40 MG tablet Take 40 mg by mouth at bedtime.     . calcium carbonate (OS-CAL) 600 MG TABS tablet Take 600 mg by mouth daily with breakfast.    . cetirizine (ZYRTEC) 10 MG tablet Take 10 mg by mouth daily.      . citalopram (CELEXA) 10 MG tablet Take 10 mg by mouth daily.    . pantoprazole (PROTONIX) 40 MG tablet TAKE 1 TABLET(40 MG) BY MOUTH EVERY DAY    . valsartan-hydrochlorothiazide (DIOVAN-HCT) 320-25 MG per tablet Take 1 tablet by mouth daily.      No current facility-administered medications for this visit.     Allergies  Allergen Reactions  . Sulfonamide Derivatives   .  Sulfa Antibiotics Itching and Rash    Review of Systems negative except from HPI and PMH  Physical Exam BP 140/60 (BP Location: Left Arm, Patient Position: Sitting, Cuff Size: Normal)   Pulse (!) 58   Ht 5\' 4"  (1.626 m)   Wt 200 lb (90.7 kg)   BMI 34.33 kg/m  Well developed and nourished in no acute distress HENT normal Neck supple with JVP-flat Clear Regular rate and rhythm, no murmurs or gallops Abd-soft with active BS No Clubbing cyanosis edema Skin-warm and dry A & Oriented  Grossly normal sensory and motor function  ECG demonstrate sinus @ 58 20/07/45  Assessment and  Plan  PACs/PVCs-symptomatic  Amiodarone therapy-high risk  Cardiomyopathy recovered  Hypertension  Chest Pain     BP much better controlled and with it the CP is much less.  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

## 2018-12-28 NOTE — Patient Instructions (Signed)
Medication Instructions:  - Your physician recommends that you continue on your current medications as directed. Please refer to the Current Medication list given to you today.  If you need a refill on your cardiac medications before your next appointment, please call your pharmacy.   Lab work: - none ordered  If you have labs (blood work) drawn today and your tests are completely normal, you will receive your results only by: Marland Kitchen MyChart Message (if you have MyChart) OR . A paper copy in the mail If you have any lab test that is abnormal or we need to change your treatment, we will call you to review the results.  Testing/Procedures: - none ordered  Follow-Up: At [facility], you and your health needs are our priority.  As part of our continuing mission to provide you with exceptional heart care, we have created designated Provider Care Teams.  These Care Teams include your primary Cardiologist (physician) and Advanced Practice Providers (APPs -  Physician Assistants and Nurse Practitioners) who all work together to provide you with the care you need, when you need it. . You will need a follow up appointment in 6 months with Dr. Caryl Comes.  Please call our office 2 months in advance to schedule this appointment.    Any Other Special Instructions Will Be Listed Below (If Applicable). - N/A

## 2019-01-17 DIAGNOSIS — H01003 Unspecified blepharitis right eye, unspecified eyelid: Secondary | ICD-10-CM | POA: Diagnosis not present

## 2019-01-17 DIAGNOSIS — H524 Presbyopia: Secondary | ICD-10-CM | POA: Diagnosis not present

## 2019-01-31 DIAGNOSIS — I480 Paroxysmal atrial fibrillation: Secondary | ICD-10-CM | POA: Diagnosis not present

## 2019-01-31 DIAGNOSIS — E785 Hyperlipidemia, unspecified: Secondary | ICD-10-CM | POA: Diagnosis not present

## 2019-01-31 DIAGNOSIS — E538 Deficiency of other specified B group vitamins: Secondary | ICD-10-CM | POA: Diagnosis not present

## 2019-01-31 DIAGNOSIS — I10 Essential (primary) hypertension: Secondary | ICD-10-CM | POA: Diagnosis not present

## 2019-02-07 DIAGNOSIS — F3342 Major depressive disorder, recurrent, in full remission: Secondary | ICD-10-CM | POA: Diagnosis not present

## 2019-02-07 DIAGNOSIS — Z0000 Encounter for general adult medical examination without abnormal findings: Secondary | ICD-10-CM | POA: Diagnosis not present

## 2019-02-07 DIAGNOSIS — Z79899 Other long term (current) drug therapy: Secondary | ICD-10-CM | POA: Diagnosis not present

## 2019-02-07 DIAGNOSIS — G4733 Obstructive sleep apnea (adult) (pediatric): Secondary | ICD-10-CM | POA: Diagnosis not present

## 2019-02-07 DIAGNOSIS — R918 Other nonspecific abnormal finding of lung field: Secondary | ICD-10-CM | POA: Diagnosis not present

## 2019-02-07 DIAGNOSIS — K219 Gastro-esophageal reflux disease without esophagitis: Secondary | ICD-10-CM | POA: Diagnosis not present

## 2019-02-07 DIAGNOSIS — I10 Essential (primary) hypertension: Secondary | ICD-10-CM | POA: Diagnosis not present

## 2019-02-07 DIAGNOSIS — I480 Paroxysmal atrial fibrillation: Secondary | ICD-10-CM | POA: Diagnosis not present

## 2019-02-07 DIAGNOSIS — E785 Hyperlipidemia, unspecified: Secondary | ICD-10-CM | POA: Diagnosis not present

## 2019-02-07 DIAGNOSIS — E538 Deficiency of other specified B group vitamins: Secondary | ICD-10-CM | POA: Diagnosis not present

## 2019-03-01 DIAGNOSIS — Z79899 Other long term (current) drug therapy: Secondary | ICD-10-CM | POA: Diagnosis not present

## 2019-03-01 DIAGNOSIS — I10 Essential (primary) hypertension: Secondary | ICD-10-CM | POA: Diagnosis not present

## 2019-04-03 ENCOUNTER — Ambulatory Visit
Admission: RE | Admit: 2019-04-03 | Discharge: 2019-04-03 | Disposition: A | Discharge status: Home / Self Care | Payer: PPO | Source: Ambulatory Visit | Attending: Internal Medicine | Admitting: Internal Medicine

## 2019-04-03 ENCOUNTER — Other Ambulatory Visit (HOSPITAL_COMMUNITY): Payer: Self-pay | Admitting: Internal Medicine

## 2019-04-03 ENCOUNTER — Other Ambulatory Visit: Payer: Self-pay

## 2019-04-03 ENCOUNTER — Other Ambulatory Visit: Payer: Self-pay | Admitting: Internal Medicine

## 2019-04-03 DIAGNOSIS — I10 Essential (primary) hypertension: Secondary | ICD-10-CM | POA: Diagnosis not present

## 2019-04-03 DIAGNOSIS — I480 Paroxysmal atrial fibrillation: Secondary | ICD-10-CM | POA: Diagnosis not present

## 2019-04-03 DIAGNOSIS — R51 Headache: Secondary | ICD-10-CM | POA: Insufficient documentation

## 2019-04-03 DIAGNOSIS — G8929 Other chronic pain: Secondary | ICD-10-CM

## 2019-04-03 DIAGNOSIS — F3342 Major depressive disorder, recurrent, in full remission: Secondary | ICD-10-CM | POA: Diagnosis not present

## 2019-04-03 DIAGNOSIS — R519 Headache, unspecified: Secondary | ICD-10-CM

## 2019-04-03 DIAGNOSIS — Z0000 Encounter for general adult medical examination without abnormal findings: Secondary | ICD-10-CM | POA: Diagnosis not present

## 2019-05-08 DIAGNOSIS — M9901 Segmental and somatic dysfunction of cervical region: Secondary | ICD-10-CM | POA: Diagnosis not present

## 2019-05-08 DIAGNOSIS — G44209 Tension-type headache, unspecified, not intractable: Secondary | ICD-10-CM | POA: Diagnosis not present

## 2019-05-08 DIAGNOSIS — M5413 Radiculopathy, cervicothoracic region: Secondary | ICD-10-CM | POA: Diagnosis not present

## 2019-05-08 DIAGNOSIS — M62838 Other muscle spasm: Secondary | ICD-10-CM | POA: Diagnosis not present

## 2019-05-10 DIAGNOSIS — G44209 Tension-type headache, unspecified, not intractable: Secondary | ICD-10-CM | POA: Diagnosis not present

## 2019-05-10 DIAGNOSIS — M62838 Other muscle spasm: Secondary | ICD-10-CM | POA: Diagnosis not present

## 2019-05-10 DIAGNOSIS — M9901 Segmental and somatic dysfunction of cervical region: Secondary | ICD-10-CM | POA: Diagnosis not present

## 2019-05-10 DIAGNOSIS — M5413 Radiculopathy, cervicothoracic region: Secondary | ICD-10-CM | POA: Diagnosis not present

## 2019-05-15 DIAGNOSIS — M62838 Other muscle spasm: Secondary | ICD-10-CM | POA: Diagnosis not present

## 2019-05-15 DIAGNOSIS — M9901 Segmental and somatic dysfunction of cervical region: Secondary | ICD-10-CM | POA: Diagnosis not present

## 2019-05-15 DIAGNOSIS — M5413 Radiculopathy, cervicothoracic region: Secondary | ICD-10-CM | POA: Diagnosis not present

## 2019-05-15 DIAGNOSIS — G44209 Tension-type headache, unspecified, not intractable: Secondary | ICD-10-CM | POA: Diagnosis not present

## 2019-05-17 DIAGNOSIS — M9901 Segmental and somatic dysfunction of cervical region: Secondary | ICD-10-CM | POA: Diagnosis not present

## 2019-05-17 DIAGNOSIS — M62838 Other muscle spasm: Secondary | ICD-10-CM | POA: Diagnosis not present

## 2019-05-17 DIAGNOSIS — M5413 Radiculopathy, cervicothoracic region: Secondary | ICD-10-CM | POA: Diagnosis not present

## 2019-05-17 DIAGNOSIS — G44209 Tension-type headache, unspecified, not intractable: Secondary | ICD-10-CM | POA: Diagnosis not present

## 2019-05-22 DIAGNOSIS — G44209 Tension-type headache, unspecified, not intractable: Secondary | ICD-10-CM | POA: Diagnosis not present

## 2019-05-22 DIAGNOSIS — M62838 Other muscle spasm: Secondary | ICD-10-CM | POA: Diagnosis not present

## 2019-05-22 DIAGNOSIS — M9901 Segmental and somatic dysfunction of cervical region: Secondary | ICD-10-CM | POA: Diagnosis not present

## 2019-05-22 DIAGNOSIS — M5413 Radiculopathy, cervicothoracic region: Secondary | ICD-10-CM | POA: Diagnosis not present

## 2019-05-29 DIAGNOSIS — M9901 Segmental and somatic dysfunction of cervical region: Secondary | ICD-10-CM | POA: Diagnosis not present

## 2019-05-29 DIAGNOSIS — G44209 Tension-type headache, unspecified, not intractable: Secondary | ICD-10-CM | POA: Diagnosis not present

## 2019-05-29 DIAGNOSIS — M5413 Radiculopathy, cervicothoracic region: Secondary | ICD-10-CM | POA: Diagnosis not present

## 2019-05-29 DIAGNOSIS — M62838 Other muscle spasm: Secondary | ICD-10-CM | POA: Diagnosis not present

## 2019-06-05 DIAGNOSIS — M62838 Other muscle spasm: Secondary | ICD-10-CM | POA: Diagnosis not present

## 2019-06-05 DIAGNOSIS — M9901 Segmental and somatic dysfunction of cervical region: Secondary | ICD-10-CM | POA: Diagnosis not present

## 2019-06-05 DIAGNOSIS — G44209 Tension-type headache, unspecified, not intractable: Secondary | ICD-10-CM | POA: Diagnosis not present

## 2019-06-05 DIAGNOSIS — M5413 Radiculopathy, cervicothoracic region: Secondary | ICD-10-CM | POA: Diagnosis not present

## 2019-06-19 DIAGNOSIS — M62838 Other muscle spasm: Secondary | ICD-10-CM | POA: Diagnosis not present

## 2019-06-19 DIAGNOSIS — G44209 Tension-type headache, unspecified, not intractable: Secondary | ICD-10-CM | POA: Diagnosis not present

## 2019-06-19 DIAGNOSIS — M5413 Radiculopathy, cervicothoracic region: Secondary | ICD-10-CM | POA: Diagnosis not present

## 2019-06-19 DIAGNOSIS — M9901 Segmental and somatic dysfunction of cervical region: Secondary | ICD-10-CM | POA: Diagnosis not present

## 2019-07-02 DIAGNOSIS — Z1283 Encounter for screening for malignant neoplasm of skin: Secondary | ICD-10-CM | POA: Diagnosis not present

## 2019-07-02 DIAGNOSIS — Z808 Family history of malignant neoplasm of other organs or systems: Secondary | ICD-10-CM | POA: Diagnosis not present

## 2019-07-02 DIAGNOSIS — D225 Melanocytic nevi of trunk: Secondary | ICD-10-CM | POA: Diagnosis not present

## 2019-07-02 DIAGNOSIS — D1801 Hemangioma of skin and subcutaneous tissue: Secondary | ICD-10-CM | POA: Diagnosis not present

## 2019-07-02 DIAGNOSIS — R238 Other skin changes: Secondary | ICD-10-CM | POA: Diagnosis not present

## 2019-07-02 DIAGNOSIS — Z1389 Encounter for screening for other disorder: Secondary | ICD-10-CM | POA: Diagnosis not present

## 2019-07-02 DIAGNOSIS — L812 Freckles: Secondary | ICD-10-CM | POA: Diagnosis not present

## 2019-07-02 DIAGNOSIS — L821 Other seborrheic keratosis: Secondary | ICD-10-CM | POA: Diagnosis not present

## 2019-07-02 DIAGNOSIS — L570 Actinic keratosis: Secondary | ICD-10-CM | POA: Diagnosis not present

## 2019-08-01 DIAGNOSIS — Z79899 Other long term (current) drug therapy: Secondary | ICD-10-CM | POA: Diagnosis not present

## 2019-08-01 DIAGNOSIS — I10 Essential (primary) hypertension: Secondary | ICD-10-CM | POA: Diagnosis not present

## 2019-08-01 DIAGNOSIS — E538 Deficiency of other specified B group vitamins: Secondary | ICD-10-CM | POA: Diagnosis not present

## 2019-08-01 DIAGNOSIS — G4733 Obstructive sleep apnea (adult) (pediatric): Secondary | ICD-10-CM | POA: Diagnosis not present

## 2019-08-01 DIAGNOSIS — E785 Hyperlipidemia, unspecified: Secondary | ICD-10-CM | POA: Diagnosis not present

## 2019-08-09 ENCOUNTER — Other Ambulatory Visit: Payer: Self-pay | Admitting: Internal Medicine

## 2019-08-09 DIAGNOSIS — Z1231 Encounter for screening mammogram for malignant neoplasm of breast: Secondary | ICD-10-CM

## 2019-08-14 DIAGNOSIS — E785 Hyperlipidemia, unspecified: Secondary | ICD-10-CM | POA: Diagnosis not present

## 2019-08-14 DIAGNOSIS — I4581 Long QT syndrome: Secondary | ICD-10-CM | POA: Diagnosis not present

## 2019-08-14 DIAGNOSIS — I480 Paroxysmal atrial fibrillation: Secondary | ICD-10-CM | POA: Diagnosis not present

## 2019-08-14 DIAGNOSIS — Z780 Asymptomatic menopausal state: Secondary | ICD-10-CM | POA: Diagnosis not present

## 2019-08-14 DIAGNOSIS — G4733 Obstructive sleep apnea (adult) (pediatric): Secondary | ICD-10-CM | POA: Diagnosis not present

## 2019-08-14 DIAGNOSIS — F3342 Major depressive disorder, recurrent, in full remission: Secondary | ICD-10-CM | POA: Diagnosis not present

## 2019-08-14 DIAGNOSIS — N183 Chronic kidney disease, stage 3 unspecified: Secondary | ICD-10-CM | POA: Insufficient documentation

## 2019-08-14 DIAGNOSIS — I10 Essential (primary) hypertension: Secondary | ICD-10-CM | POA: Diagnosis not present

## 2019-08-14 DIAGNOSIS — K219 Gastro-esophageal reflux disease without esophagitis: Secondary | ICD-10-CM | POA: Diagnosis not present

## 2019-08-14 DIAGNOSIS — E538 Deficiency of other specified B group vitamins: Secondary | ICD-10-CM | POA: Diagnosis not present

## 2019-09-05 DIAGNOSIS — H1031 Unspecified acute conjunctivitis, right eye: Secondary | ICD-10-CM | POA: Diagnosis not present

## 2019-09-17 ENCOUNTER — Ambulatory Visit
Admission: RE | Admit: 2019-09-17 | Discharge: 2019-09-17 | Disposition: A | Discharge status: Home / Self Care | Payer: PPO | Source: Ambulatory Visit | Attending: Internal Medicine | Admitting: Internal Medicine

## 2019-09-17 DIAGNOSIS — Z1231 Encounter for screening mammogram for malignant neoplasm of breast: Secondary | ICD-10-CM

## 2019-09-24 ENCOUNTER — Other Ambulatory Visit: Payer: Self-pay | Admitting: Internal Medicine

## 2019-09-24 DIAGNOSIS — N6489 Other specified disorders of breast: Secondary | ICD-10-CM

## 2019-09-24 DIAGNOSIS — R928 Other abnormal and inconclusive findings on diagnostic imaging of breast: Secondary | ICD-10-CM

## 2019-09-26 ENCOUNTER — Other Ambulatory Visit: Payer: Self-pay

## 2019-09-26 ENCOUNTER — Encounter: Payer: Self-pay | Admitting: Podiatry

## 2019-09-26 ENCOUNTER — Ambulatory Visit (INDEPENDENT_AMBULATORY_CARE_PROVIDER_SITE_OTHER): Payer: PPO

## 2019-09-26 ENCOUNTER — Other Ambulatory Visit: Payer: Self-pay | Admitting: Podiatry

## 2019-09-26 ENCOUNTER — Ambulatory Visit: Payer: PPO | Admitting: Podiatry

## 2019-09-26 VITALS — BP 140/66

## 2019-09-26 DIAGNOSIS — M2041 Other hammer toe(s) (acquired), right foot: Secondary | ICD-10-CM | POA: Diagnosis not present

## 2019-09-26 DIAGNOSIS — M79671 Pain in right foot: Secondary | ICD-10-CM | POA: Diagnosis not present

## 2019-09-26 NOTE — Patient Instructions (Signed)
Pre-Operative Instructions  Congratulations, you have decided to take an important step towards improving your quality of life.  You can be assured that the doctors and staff at [facility] will be with you every step of the way.  Here are some important things you should know:  1. Plan to be at the surgery center/hospital at least 1 (one) hour prior to your scheduled time, unless otherwise directed by the surgical center/hospital staff.  You must have a responsible adult accompany you, remain during the surgery and drive you home.  Make sure you have directions to the surgical center/hospital to ensure you arrive on time. 2. If you are having surgery at [facility] or [facility], you will need a copy of your medical history and physical form from your family physician within one month prior to the date of surgery. We will give you a form for your primary physician to complete.  3. We make every effort to accommodate the date you request for surgery.  However, there are times where surgery dates or times have to be moved.  We will contact you as soon as possible if a change in schedule is required.   4. No aspirin/ibuprofen for one week before surgery.  If you are on aspirin, any non-steroidal anti-inflammatory medications (Mobic, Aleve, Ibuprofen) should not be taken seven (7) days prior to your surgery.  You make take Tylenol for pain prior to surgery.  5. Medications - If you are taking daily heart and blood pressure medications, seizure, reflux, allergy, asthma, anxiety, pain or diabetes medications, make sure you notify the surgery center/hospital before the day of surgery so they can tell you which medications you should take or avoid the day of surgery. 6. No food or drink after midnight the night before surgery unless directed otherwise by surgical center/hospital staff. 7. No alcoholic beverages 24-hours prior to surgery.  No smoking 24-hours prior or 24-hours after  surgery. 8. Wear loose pants or shorts. They should be loose enough to fit over bandages, boots, and casts. 9. Don't wear slip-on shoes. Sneakers are preferred. 10. Bring your boot with you to the surgery center/hospital.  Also bring crutches or a walker if your physician has prescribed it for you.  If you do not have this equipment, it will be provided for you after surgery. 11. If you have not been contacted by the surgery center/hospital by the day before your surgery, call to confirm the date and time of your surgery. 12. Leave-time from work may vary depending on the type of surgery you have.  Appropriate arrangements should be made prior to surgery with your employer. 13. Prescriptions will be provided immediately following surgery by your doctor.  Fill these as soon as possible after surgery and take the medication as directed. Pain medications will not be refilled on weekends and must be approved by the doctor. 14. Remove nail polish on the operative foot and avoid getting pedicures prior to surgery. 15. Wash the night before surgery.  The night before surgery wash the foot and leg well with water and the antibacterial soap provided. Be sure to pay special attention to beneath the toenails and in between the toes.  Wash for at least three (3) minutes. Rinse thoroughly with water and dry well with a towel.  Perform this wash unless told not to do so by your physician.  Enclosed: 1 Ice pack (please put in freezer the night before surgery)   1 Hibiclens skin cleaner     Pre-op instructions  If you have any questions regarding the instructions, please do not hesitate to call our office.  [place]: [address], [place], [place] [address] -- [phone]  [place]: [address] [place], [place] [address] -- [phone]  [place]: [address]  [place], [place] [address] -- [phone]   Website: [email or web address] 

## 2019-09-28 ENCOUNTER — Ambulatory Visit
Admission: RE | Admit: 2019-09-28 | Discharge: 2019-09-28 | Disposition: A | Discharge status: Home / Self Care | Payer: PPO | Source: Ambulatory Visit | Attending: Internal Medicine | Admitting: Internal Medicine

## 2019-09-28 DIAGNOSIS — R928 Other abnormal and inconclusive findings on diagnostic imaging of breast: Secondary | ICD-10-CM | POA: Diagnosis not present

## 2019-09-28 DIAGNOSIS — N6489 Other specified disorders of breast: Secondary | ICD-10-CM | POA: Diagnosis not present

## 2019-10-01 ENCOUNTER — Telehealth: Payer: Self-pay | Admitting: Podiatry

## 2019-10-01 NOTE — Progress Notes (Signed)
Subjective:   Patient ID: Christina Underwood, female   DOB: 73 y.o.   MRN: [id number]   HPI Patient presents with significant digital deformity second right that is become increasingly painful and she had to have her left one straight and she wants the right one straight in the same way.  Patient does not smoke likes to be active and is had this going on for a while and it is gotten worse over the last 6 months   Review of Systems  All other systems reviewed and are negative.       Objective:  Physical Exam Vitals signs and nursing note reviewed.  Constitutional:      Appearance: She is well-developed.  Pulmonary:     Effort: Pulmonary effort is normal.  Musculoskeletal: Normal range of motion.  Skin:    General: Skin is warm.  Neurological:     Mental Status: She is alert.     Neurovascular status intact muscle strength was found to be adequate range of motion within normal limits.  Patient is noted to have significant elevation second digit right foot with redness at the inner phalangeal joint and pain with palpation and it appears to be relatively spontaneous with possibility of flexor plate stretch or tear which may have been contributory.  Patient is no MPJ pain and has good digital perfusion range of motion noted       Assessment:  Hammertoe deformity with pain second digit right with redness and worsening of condition with rigid contracture noted currently     Plan:  A H&P discussed condition and recommended digital fusion explaining procedure risk with patient.  Patient wants surgery and is willing to accept the risk of procedure and understands recovery and the fact there is no long-term guarantees this will solve the problem.  She is willing to accept risk signed consent form after review with is scheduled for outpatient surgery and is encouraged to call with questions which may come up prior to the procedure  X-ray indicates elevated second digit right with rigid  contracture at the MPJ and deformity at the proximal to phalangeal joint

## 2019-10-01 NOTE — Telephone Encounter (Signed)
DOS: 10/09/2019 SURGICAL PROCEDURE: Hammertoe Repair w/ Pin 2nd Rt CPT CODE: 60454 DX CODE: M20.41  [employer]  Authorization Number: [id number] Valid from 10/01/2019 - 12/30/2019

## 2019-10-09 ENCOUNTER — Encounter: Payer: Self-pay | Admitting: Podiatry

## 2019-10-09 DIAGNOSIS — M2041 Other hammer toe(s) (acquired), right foot: Secondary | ICD-10-CM | POA: Diagnosis not present

## 2019-10-09 DIAGNOSIS — G4730 Sleep apnea, unspecified: Secondary | ICD-10-CM | POA: Diagnosis not present

## 2019-10-10 ENCOUNTER — Telehealth: Payer: Self-pay | Admitting: Podiatry

## 2019-10-10 NOTE — Telephone Encounter (Signed)
Pt had hammertoe surgery yesterday and was told to only be up on her foot to use the bathroom or fix something to eat. Pt would like to know how long that will be the case and when she will be able to be on her foot again. Please give patient a call.

## 2019-10-10 NOTE — Telephone Encounter (Signed)
I informed pt, she should not be up on her surgery foot or have it below her heart more than 15 minutes/hour for 7-10 days and after the 1st POV Dr. Paulla Dolly would instruct on weight bearing at that time.

## 2019-10-15 ENCOUNTER — Encounter: Payer: Self-pay | Admitting: Podiatry

## 2019-10-15 ENCOUNTER — Ambulatory Visit (INDEPENDENT_AMBULATORY_CARE_PROVIDER_SITE_OTHER): Payer: PPO

## 2019-10-15 ENCOUNTER — Ambulatory Visit (INDEPENDENT_AMBULATORY_CARE_PROVIDER_SITE_OTHER): Payer: PPO | Admitting: Podiatry

## 2019-10-15 ENCOUNTER — Other Ambulatory Visit: Payer: Self-pay

## 2019-10-15 VITALS — BP 135/65 | HR 55 | Temp 97.9°F | Resp 16

## 2019-10-15 DIAGNOSIS — F3342 Major depressive disorder, recurrent, in full remission: Secondary | ICD-10-CM | POA: Insufficient documentation

## 2019-10-15 DIAGNOSIS — M2041 Other hammer toe(s) (acquired), right foot: Secondary | ICD-10-CM

## 2019-10-15 NOTE — Progress Notes (Signed)
Subjective:   Patient ID: Christina Underwood, female   DOB: 73 y.o.   MRN: [id number]   HPI Patient states doing really well on the right foot with digit in good alignment pin in place   ROS      Objective:  Physical Exam  Neurovascular status intact negative Bevelyn Buckles' sign noted with patient second digit right straight slight medial displacement with stitches intact wound edges well coapted     Assessment:  Doing well post digital fusion digit to right     Plan:  H&P x-rays reviewed sterile dressing reapplied and discussed continued surgical shoe usage elevation and reappoint 2 weeks suture removal or earlier if needed  X-ray indicates pins in very good alignment with the second toe looking to be in good functional position

## 2019-10-24 DIAGNOSIS — C4491 Basal cell carcinoma of skin, unspecified: Secondary | ICD-10-CM

## 2019-10-24 DIAGNOSIS — C44319 Basal cell carcinoma of skin of other parts of face: Secondary | ICD-10-CM | POA: Diagnosis not present

## 2019-10-24 DIAGNOSIS — D229 Melanocytic nevi, unspecified: Secondary | ICD-10-CM | POA: Diagnosis not present

## 2019-10-24 DIAGNOSIS — L821 Other seborrheic keratosis: Secondary | ICD-10-CM | POA: Diagnosis not present

## 2019-10-24 DIAGNOSIS — L820 Inflamed seborrheic keratosis: Secondary | ICD-10-CM | POA: Diagnosis not present

## 2019-10-24 DIAGNOSIS — D485 Neoplasm of uncertain behavior of skin: Secondary | ICD-10-CM | POA: Diagnosis not present

## 2019-10-24 DIAGNOSIS — L578 Other skin changes due to chronic exposure to nonionizing radiation: Secondary | ICD-10-CM | POA: Diagnosis not present

## 2019-10-24 HISTORY — DX: Basal cell carcinoma of skin, unspecified: C44.91

## 2019-10-29 ENCOUNTER — Other Ambulatory Visit: Payer: Self-pay

## 2019-10-29 ENCOUNTER — Ambulatory Visit (INDEPENDENT_AMBULATORY_CARE_PROVIDER_SITE_OTHER): Payer: PPO | Admitting: Podiatry

## 2019-10-29 ENCOUNTER — Ambulatory Visit (INDEPENDENT_AMBULATORY_CARE_PROVIDER_SITE_OTHER): Payer: PPO

## 2019-10-29 ENCOUNTER — Encounter: Payer: Self-pay | Admitting: Podiatry

## 2019-10-29 DIAGNOSIS — M2041 Other hammer toe(s) (acquired), right foot: Secondary | ICD-10-CM | POA: Diagnosis not present

## 2019-10-29 DIAGNOSIS — M79671 Pain in right foot: Secondary | ICD-10-CM

## 2019-10-31 DIAGNOSIS — C44319 Basal cell carcinoma of skin of other parts of face: Secondary | ICD-10-CM | POA: Diagnosis not present

## 2019-11-05 NOTE — Progress Notes (Signed)
Subjective:   Patient ID: Christina Underwood, female   DOB: 73 y.o.   MRN: [id number]   HPI Patient states doing well with moderate elevation of the second toe but satisfied with position   ROS      Objective:  Physical Exam  Neurovascular status intact negative Bevelyn Buckles' sign noted second digit moderately elevated with stitches in place wound edges well coapted pin in place     Assessment:  Overall doing well with mild elevation second digit right     Plan:  Sterile dressing reapplied after removal of stitches with wound edges well coapted and applied BioSkin to lower the second toe and patient will be seen back again for [place] to recheck in for pin removal in the next 2 weeks  X-rays indicate good alignment with pin in place

## 2019-11-19 ENCOUNTER — Ambulatory Visit (INDEPENDENT_AMBULATORY_CARE_PROVIDER_SITE_OTHER): Payer: PPO | Admitting: Podiatry

## 2019-11-19 ENCOUNTER — Encounter: Payer: Self-pay | Admitting: Podiatry

## 2019-11-19 ENCOUNTER — Other Ambulatory Visit: Payer: Self-pay

## 2019-11-19 ENCOUNTER — Ambulatory Visit (INDEPENDENT_AMBULATORY_CARE_PROVIDER_SITE_OTHER): Payer: PPO

## 2019-11-19 DIAGNOSIS — M2041 Other hammer toe(s) (acquired), right foot: Secondary | ICD-10-CM

## 2019-11-19 NOTE — Progress Notes (Signed)
Subjective:   Patient ID: Christina Underwood, female   DOB: 73 y.o.   MRN: [id number]   HPI Patient states doing really well with toe and ready to get pin out   ROS      Objective:  Physical Exam  Neurovascular status intact digit to right intact good alignment with pin in place and brace which is helping to hold the toe in good position     Assessment:  Doing well post digital fusion digit to right     Plan:  Pin removed sterile dressing applied x-ray reviewed and gradual return to soft shoe gear over the next couple weeks and will be seen back as needed  X-rays indicate good alignment good position of the second toe with good site noted

## 2019-12-04 DIAGNOSIS — C44319 Basal cell carcinoma of skin of other parts of face: Secondary | ICD-10-CM | POA: Diagnosis not present

## 2019-12-04 DIAGNOSIS — L988 Other specified disorders of the skin and subcutaneous tissue: Secondary | ICD-10-CM | POA: Diagnosis not present

## 2019-12-11 DIAGNOSIS — C44319 Basal cell carcinoma of skin of other parts of face: Secondary | ICD-10-CM | POA: Diagnosis not present

## 2019-12-11 DIAGNOSIS — Z4802 Encounter for removal of sutures: Secondary | ICD-10-CM | POA: Diagnosis not present

## 2020-01-21 DIAGNOSIS — H43812 Vitreous degeneration, left eye: Secondary | ICD-10-CM | POA: Diagnosis not present

## 2020-02-11 DIAGNOSIS — L578 Other skin changes due to chronic exposure to nonionizing radiation: Secondary | ICD-10-CM | POA: Diagnosis not present

## 2020-02-11 DIAGNOSIS — D1801 Hemangioma of skin and subcutaneous tissue: Secondary | ICD-10-CM | POA: Diagnosis not present

## 2020-02-11 DIAGNOSIS — Z1283 Encounter for screening for malignant neoplasm of skin: Secondary | ICD-10-CM | POA: Diagnosis not present

## 2020-02-11 DIAGNOSIS — L304 Erythema intertrigo: Secondary | ICD-10-CM | POA: Diagnosis not present

## 2020-02-11 DIAGNOSIS — D2230 Melanocytic nevi of unspecified part of face: Secondary | ICD-10-CM | POA: Diagnosis not present

## 2020-02-11 DIAGNOSIS — Z85828 Personal history of other malignant neoplasm of skin: Secondary | ICD-10-CM | POA: Diagnosis not present

## 2020-02-11 DIAGNOSIS — D225 Melanocytic nevi of trunk: Secondary | ICD-10-CM | POA: Diagnosis not present

## 2020-02-11 DIAGNOSIS — L570 Actinic keratosis: Secondary | ICD-10-CM | POA: Diagnosis not present

## 2020-02-11 DIAGNOSIS — L821 Other seborrheic keratosis: Secondary | ICD-10-CM | POA: Diagnosis not present

## 2020-02-11 DIAGNOSIS — D229 Melanocytic nevi, unspecified: Secondary | ICD-10-CM | POA: Diagnosis not present

## 2020-02-12 DIAGNOSIS — N183 Chronic kidney disease, stage 3 unspecified: Secondary | ICD-10-CM | POA: Diagnosis not present

## 2020-02-12 DIAGNOSIS — E785 Hyperlipidemia, unspecified: Secondary | ICD-10-CM | POA: Diagnosis not present

## 2020-02-12 DIAGNOSIS — E538 Deficiency of other specified B group vitamins: Secondary | ICD-10-CM | POA: Diagnosis not present

## 2020-02-12 DIAGNOSIS — I480 Paroxysmal atrial fibrillation: Secondary | ICD-10-CM | POA: Diagnosis not present

## 2020-02-12 DIAGNOSIS — I10 Essential (primary) hypertension: Secondary | ICD-10-CM | POA: Diagnosis not present

## 2020-02-21 DIAGNOSIS — E538 Deficiency of other specified B group vitamins: Secondary | ICD-10-CM | POA: Diagnosis not present

## 2020-02-21 DIAGNOSIS — I480 Paroxysmal atrial fibrillation: Secondary | ICD-10-CM | POA: Diagnosis not present

## 2020-02-21 DIAGNOSIS — I4581 Long QT syndrome: Secondary | ICD-10-CM | POA: Diagnosis not present

## 2020-02-21 DIAGNOSIS — Z0000 Encounter for general adult medical examination without abnormal findings: Secondary | ICD-10-CM | POA: Diagnosis not present

## 2020-02-21 DIAGNOSIS — E785 Hyperlipidemia, unspecified: Secondary | ICD-10-CM | POA: Diagnosis not present

## 2020-02-21 DIAGNOSIS — N183 Chronic kidney disease, stage 3 unspecified: Secondary | ICD-10-CM | POA: Diagnosis not present

## 2020-02-21 DIAGNOSIS — I10 Essential (primary) hypertension: Secondary | ICD-10-CM | POA: Diagnosis not present

## 2020-02-21 DIAGNOSIS — K219 Gastro-esophageal reflux disease without esophagitis: Secondary | ICD-10-CM | POA: Diagnosis not present

## 2020-02-21 DIAGNOSIS — F3342 Major depressive disorder, recurrent, in full remission: Secondary | ICD-10-CM | POA: Diagnosis not present

## 2020-02-21 DIAGNOSIS — G4733 Obstructive sleep apnea (adult) (pediatric): Secondary | ICD-10-CM | POA: Diagnosis not present

## 2020-03-22 ENCOUNTER — Other Ambulatory Visit: Payer: Self-pay | Admitting: Internal Medicine

## 2020-03-24 ENCOUNTER — Other Ambulatory Visit: Payer: Self-pay | Admitting: Internal Medicine

## 2020-03-26 ENCOUNTER — Other Ambulatory Visit: Payer: Self-pay | Admitting: Internal Medicine

## 2020-03-28 ENCOUNTER — Other Ambulatory Visit: Payer: Self-pay | Admitting: Internal Medicine

## 2020-03-28 NOTE — Telephone Encounter (Signed)
Scheduled

## 2020-03-28 NOTE — Telephone Encounter (Signed)
Please schedule overdue F/U with Dr. Caryl Comes for refills. Thank you!

## 2020-04-15 DIAGNOSIS — M461 Sacroiliitis, not elsewhere classified: Secondary | ICD-10-CM | POA: Diagnosis not present

## 2020-04-15 DIAGNOSIS — M9904 Segmental and somatic dysfunction of sacral region: Secondary | ICD-10-CM | POA: Diagnosis not present

## 2020-04-15 DIAGNOSIS — M5413 Radiculopathy, cervicothoracic region: Secondary | ICD-10-CM | POA: Diagnosis not present

## 2020-04-15 DIAGNOSIS — M9901 Segmental and somatic dysfunction of cervical region: Secondary | ICD-10-CM | POA: Diagnosis not present

## 2020-04-15 DIAGNOSIS — M9903 Segmental and somatic dysfunction of lumbar region: Secondary | ICD-10-CM | POA: Diagnosis not present

## 2020-04-15 DIAGNOSIS — M5441 Lumbago with sciatica, right side: Secondary | ICD-10-CM | POA: Diagnosis not present

## 2020-04-17 DIAGNOSIS — M9904 Segmental and somatic dysfunction of sacral region: Secondary | ICD-10-CM | POA: Diagnosis not present

## 2020-04-17 DIAGNOSIS — M5413 Radiculopathy, cervicothoracic region: Secondary | ICD-10-CM | POA: Diagnosis not present

## 2020-04-17 DIAGNOSIS — M461 Sacroiliitis, not elsewhere classified: Secondary | ICD-10-CM | POA: Diagnosis not present

## 2020-04-17 DIAGNOSIS — M9901 Segmental and somatic dysfunction of cervical region: Secondary | ICD-10-CM | POA: Diagnosis not present

## 2020-04-17 DIAGNOSIS — M9903 Segmental and somatic dysfunction of lumbar region: Secondary | ICD-10-CM | POA: Diagnosis not present

## 2020-04-17 DIAGNOSIS — M5441 Lumbago with sciatica, right side: Secondary | ICD-10-CM | POA: Diagnosis not present

## 2020-04-21 DIAGNOSIS — M9904 Segmental and somatic dysfunction of sacral region: Secondary | ICD-10-CM | POA: Diagnosis not present

## 2020-04-21 DIAGNOSIS — M461 Sacroiliitis, not elsewhere classified: Secondary | ICD-10-CM | POA: Diagnosis not present

## 2020-04-21 DIAGNOSIS — M5413 Radiculopathy, cervicothoracic region: Secondary | ICD-10-CM | POA: Diagnosis not present

## 2020-04-21 DIAGNOSIS — M9903 Segmental and somatic dysfunction of lumbar region: Secondary | ICD-10-CM | POA: Diagnosis not present

## 2020-04-21 DIAGNOSIS — M9901 Segmental and somatic dysfunction of cervical region: Secondary | ICD-10-CM | POA: Diagnosis not present

## 2020-04-21 DIAGNOSIS — M5441 Lumbago with sciatica, right side: Secondary | ICD-10-CM | POA: Diagnosis not present

## 2020-04-22 DIAGNOSIS — G8929 Other chronic pain: Secondary | ICD-10-CM | POA: Diagnosis not present

## 2020-04-22 DIAGNOSIS — M545 Low back pain: Secondary | ICD-10-CM | POA: Diagnosis not present

## 2020-04-23 DIAGNOSIS — M545 Low back pain: Secondary | ICD-10-CM | POA: Diagnosis not present

## 2020-04-24 DIAGNOSIS — M9904 Segmental and somatic dysfunction of sacral region: Secondary | ICD-10-CM | POA: Diagnosis not present

## 2020-04-24 DIAGNOSIS — M5441 Lumbago with sciatica, right side: Secondary | ICD-10-CM | POA: Diagnosis not present

## 2020-04-24 DIAGNOSIS — M9901 Segmental and somatic dysfunction of cervical region: Secondary | ICD-10-CM | POA: Diagnosis not present

## 2020-04-24 DIAGNOSIS — M5413 Radiculopathy, cervicothoracic region: Secondary | ICD-10-CM | POA: Diagnosis not present

## 2020-04-24 DIAGNOSIS — M461 Sacroiliitis, not elsewhere classified: Secondary | ICD-10-CM | POA: Diagnosis not present

## 2020-04-24 DIAGNOSIS — M9903 Segmental and somatic dysfunction of lumbar region: Secondary | ICD-10-CM | POA: Diagnosis not present

## 2020-05-15 DIAGNOSIS — G8929 Other chronic pain: Secondary | ICD-10-CM | POA: Diagnosis not present

## 2020-05-15 DIAGNOSIS — M5441 Lumbago with sciatica, right side: Secondary | ICD-10-CM | POA: Diagnosis not present

## 2020-05-15 DIAGNOSIS — M545 Low back pain: Secondary | ICD-10-CM | POA: Diagnosis not present

## 2020-05-16 ENCOUNTER — Other Ambulatory Visit: Payer: Self-pay | Admitting: Nurse Practitioner

## 2020-05-16 DIAGNOSIS — G8929 Other chronic pain: Secondary | ICD-10-CM

## 2020-05-22 ENCOUNTER — Other Ambulatory Visit: Payer: Self-pay

## 2020-05-22 ENCOUNTER — Ambulatory Visit: Payer: PPO | Admitting: Internal Medicine

## 2020-05-22 ENCOUNTER — Encounter: Payer: Self-pay | Admitting: Internal Medicine

## 2020-05-22 VITALS — BP 112/60 | HR 71 | Ht 64.0 in | Wt 204.2 lb

## 2020-05-22 DIAGNOSIS — I493 Ventricular premature depolarization: Secondary | ICD-10-CM

## 2020-05-22 DIAGNOSIS — I491 Atrial premature depolarization: Secondary | ICD-10-CM | POA: Diagnosis not present

## 2020-05-22 DIAGNOSIS — Z79899 Other long term (current) drug therapy: Secondary | ICD-10-CM

## 2020-05-22 NOTE — Patient Instructions (Signed)
Medication Instructions:  - Your physician has recommended you make the following change in your medication:   1) Decrease amiodarone 200 mg - take 1 tablet by mouth once 5 days a week  2) Stop amlodipine  *If you need a refill on your cardiac medications before your next appointment, please call your pharmacy*   Lab Work: - none ordered  If you have labs (blood work) drawn today and your tests are completely normal, you will receive your results only by: Marland Kitchen MyChart Message (if you have MyChart) OR . A paper copy in the mail If you have any lab test that is abnormal or we need to change your treatment, we will call you to review the results.   Testing/Procedures: - none ordered   Follow-Up: At [facility], you and your health needs are our priority.  As part of our continuing mission to provide you with exceptional heart care, we have created designated Provider Care Teams.  These Care Teams include your primary Cardiologist (physician) and Advanced Practice Providers (APPs -  Physician Assistants and Nurse Practitioners) who all work together to provide you with the care you need, when you need it.  We recommend signing up for the patient portal called "MyChart".  Sign up information is provided on this After Visit Summary.  MyChart is used to connect with patients for Virtual Visits (Telemedicine).  Patients are able to view lab/test results, encounter notes, upcoming appointments, etc.  Non-urgent messages can be sent to your provider as well.   To learn more about what you can do with MyChart, go to [email or web address].    Your next appointment:   6 month(s)  The format for your next appointment:   In Person  Provider:   Virl Axe, MD   Other Instructions n/a

## 2020-05-22 NOTE — Progress Notes (Signed)
Patient Care Team: Adin Hector, MD as PCP - General (Internal Medicine)   HPI  Christina Underwood is a 74 y.o. female In follow-up of chest pain.She has symptomatic PACs and PVCs. She was treated with amiodarone .she has been on low-dose therapy for some years, taking 100 mg 3 days a week. Surveillance laboratories have been followed by Dr. Roney Marion.   Has hx of GERD    BP well controlled at home 110-120s  The patient denies chest pain, shortness of breath, nocturnal dyspnea, orthopnea or peripheral edema.  There have been no palpitations, lightheadedness or syncope.    Not active 2/2 back pain  Not vaccinated   Patient denies symptoms of GI intolerance, sun sensitivity, neurological symptoms attributable to amiodarone.     DATE TEST EF   2008 Cath  No CAD  1/16 Myoview  75 % No ischemia  10/19 Myoview 75% No Ischemia I    Date Cr K TSH LFT  2/19   2.92 15  8/19    2.72 17  2/21 1.0 4.2 1.597 20          Past Medical History:  Diagnosis Date  . Abdominal discomfort    R upper quad  . Atypical chest pain   . Breast lump   . CAD (coronary artery disease)    25% RCA 2008   . Depression   . GERD (gastroesophageal reflux disease)   . HLD (hyperlipidemia)   . HTN (hypertension)   . Hypokalemia   . Long QT interval    assoc with amiodarone  . PAC (premature atrial contraction)   . Right ventricular outflow tract premature ventricular contractions (PVCs)   . Sleep apnea     Past Surgical History:  Procedure Laterality Date  . APPENDECTOMY    . bunion repairs     bilateral   . CARDIAC CATHETERIZATION     [facility]  . CARDIAC CATHETERIZATION     [facility]  Marland Kitchen CATARACT EXTRACTION Right   . COLONOSCOPY    . lumbar laminextomy  2001  . TOTAL ABDOMINAL HYSTERECTOMY W/ BILATERAL SALPINGOOPHORECTOMY    . VESICOVAGINAL FISTULA CLOSURE W/ TAH      Current Outpatient Medications  Medication Sig Dispense Refill  . amiodarone (PACERONE) 200 MG tablet Take  200 mg by mouth daily.     Marland Kitchen amLODipine (NORVASC) 5 MG tablet TAKE 1 TABLET BY MOUTH DAILY 90 tablet 0  . aspirin 81 MG EC tablet Take 81 mg by mouth daily.      Marland Kitchen atorvastatin (LIPITOR) 40 MG tablet Take 40 mg by mouth at bedtime.     . calcium carbonate (OS-CAL) 600 MG TABS tablet Take 600 mg by mouth daily with breakfast.    . cetirizine (ZYRTEC) 10 MG tablet Take 10 mg by mouth daily.      . citalopram (CELEXA) 10 MG tablet Take 10 mg by mouth daily.    Marland Kitchen escitalopram (LEXAPRO) 10 MG tablet Take 10 mg by mouth at bedtime.     . gabapentin (NEURONTIN) 300 MG capsule TAKE 3 CAPSULES BY MOUTH 90 MINUTES PRIOR TO BEDTIME    . HYDROcodone-acetaminophen (NORCO/VICODIN) 5-325 MG tablet Take 1 tablet by mouth every 4 (four) hours as needed.    . pantoprazole (PROTONIX) 40 MG tablet TAKE 1 TABLET(40 MG) BY MOUTH EVERY DAY    . valsartan-hydrochlorothiazide (DIOVAN-HCT) 320-25 MG per tablet Take 1 tablet by mouth daily.      No current facility-administered medications  for this visit.    Allergies  Allergen Reactions  . Sulfonamide Derivatives   . Sulfa Antibiotics Itching and Rash    Review of Systems negative except from HPI and PMH  Physical Exam BP 112/60 (BP Location: Left Arm, Patient Position: Sitting, Cuff Size: Normal)   Pulse 71   Ht 5\' 4"  (1.626 m)   Wt 204 lb 4 oz (92.6 kg)   SpO2 97%   BMI 35.06 kg/m  Well developed and nourished in no acute distress HENT normal Neck supple   Clear Regular rate and rhythm, no murmurs or gallops Abd-soft with active BS No Clubbing cyanosis edema Skin-warm and dry A & Oriented  Grossly normal sensory and motor function  ECG sinus @ 70  22/07/33   Assessment and  Plan  PACs/PVCs-symptomatic  Amiodarone therapy-high risk  Cardiomyopathy recovered  Hypertension  Chest Pain   Non vaccinator   PACs quiescent on amio so will decrease from 1400>>1000mg  a week to minimize cumulative dose and potential for side effects  BP  well controlled and have suggested she decrease her antihypertensives; we have discordant inform, our MAR says amlodipine 5; she says she is taking 1/2 ( of 5 or 10 mg)?? If the former, she will stop, if the latter then 1/2 of 320/25 Valsartan/HCTZ  Discussed her decision not to get the vaccine_COVID--- she wonders whether she was infected earlier this year; offered Ab testing if she were willing to consider vaccine if in fact not previously infected.  She declined   Will defer to Dr Jacinto Reap Caryl Comes but would be inclined to stop aspirin in the absence of specific indication

## 2020-06-02 ENCOUNTER — Ambulatory Visit
Admission: RE | Admit: 2020-06-02 | Discharge: 2020-06-02 | Disposition: A | Discharge status: Home / Self Care | Payer: PPO | Source: Ambulatory Visit | Attending: Nurse Practitioner | Admitting: Nurse Practitioner

## 2020-06-02 ENCOUNTER — Other Ambulatory Visit: Payer: Self-pay

## 2020-06-02 DIAGNOSIS — G8929 Other chronic pain: Secondary | ICD-10-CM

## 2020-06-02 DIAGNOSIS — M545 Low back pain: Secondary | ICD-10-CM | POA: Diagnosis not present

## 2020-06-02 DIAGNOSIS — M5441 Lumbago with sciatica, right side: Secondary | ICD-10-CM | POA: Diagnosis not present

## 2020-06-12 ENCOUNTER — Ambulatory Visit: Payer: PPO | Admitting: Internal Medicine

## 2020-06-17 DIAGNOSIS — M9904 Segmental and somatic dysfunction of sacral region: Secondary | ICD-10-CM | POA: Diagnosis not present

## 2020-06-17 DIAGNOSIS — M461 Sacroiliitis, not elsewhere classified: Secondary | ICD-10-CM | POA: Diagnosis not present

## 2020-06-17 DIAGNOSIS — M5441 Lumbago with sciatica, right side: Secondary | ICD-10-CM | POA: Diagnosis not present

## 2020-06-17 DIAGNOSIS — M9903 Segmental and somatic dysfunction of lumbar region: Secondary | ICD-10-CM | POA: Diagnosis not present

## 2020-06-19 DIAGNOSIS — M9903 Segmental and somatic dysfunction of lumbar region: Secondary | ICD-10-CM | POA: Diagnosis not present

## 2020-06-19 DIAGNOSIS — M5441 Lumbago with sciatica, right side: Secondary | ICD-10-CM | POA: Diagnosis not present

## 2020-06-19 DIAGNOSIS — M461 Sacroiliitis, not elsewhere classified: Secondary | ICD-10-CM | POA: Diagnosis not present

## 2020-06-19 DIAGNOSIS — M9904 Segmental and somatic dysfunction of sacral region: Secondary | ICD-10-CM | POA: Diagnosis not present

## 2020-06-24 ENCOUNTER — Other Ambulatory Visit: Payer: Self-pay | Admitting: Internal Medicine

## 2020-06-24 DIAGNOSIS — M9903 Segmental and somatic dysfunction of lumbar region: Secondary | ICD-10-CM | POA: Diagnosis not present

## 2020-06-24 DIAGNOSIS — M9904 Segmental and somatic dysfunction of sacral region: Secondary | ICD-10-CM | POA: Diagnosis not present

## 2020-06-24 DIAGNOSIS — M5441 Lumbago with sciatica, right side: Secondary | ICD-10-CM | POA: Diagnosis not present

## 2020-06-24 DIAGNOSIS — M461 Sacroiliitis, not elsewhere classified: Secondary | ICD-10-CM | POA: Diagnosis not present

## 2020-06-26 DIAGNOSIS — M9904 Segmental and somatic dysfunction of sacral region: Secondary | ICD-10-CM | POA: Diagnosis not present

## 2020-06-26 DIAGNOSIS — M5441 Lumbago with sciatica, right side: Secondary | ICD-10-CM | POA: Diagnosis not present

## 2020-06-26 DIAGNOSIS — M9903 Segmental and somatic dysfunction of lumbar region: Secondary | ICD-10-CM | POA: Diagnosis not present

## 2020-06-26 DIAGNOSIS — M461 Sacroiliitis, not elsewhere classified: Secondary | ICD-10-CM | POA: Diagnosis not present

## 2020-07-01 DIAGNOSIS — M5441 Lumbago with sciatica, right side: Secondary | ICD-10-CM | POA: Diagnosis not present

## 2020-07-01 DIAGNOSIS — M9903 Segmental and somatic dysfunction of lumbar region: Secondary | ICD-10-CM | POA: Diagnosis not present

## 2020-07-01 DIAGNOSIS — M461 Sacroiliitis, not elsewhere classified: Secondary | ICD-10-CM | POA: Diagnosis not present

## 2020-07-01 DIAGNOSIS — M9904 Segmental and somatic dysfunction of sacral region: Secondary | ICD-10-CM | POA: Diagnosis not present

## 2020-07-03 DIAGNOSIS — M461 Sacroiliitis, not elsewhere classified: Secondary | ICD-10-CM | POA: Diagnosis not present

## 2020-07-03 DIAGNOSIS — M9903 Segmental and somatic dysfunction of lumbar region: Secondary | ICD-10-CM | POA: Diagnosis not present

## 2020-07-03 DIAGNOSIS — M5441 Lumbago with sciatica, right side: Secondary | ICD-10-CM | POA: Diagnosis not present

## 2020-07-03 DIAGNOSIS — M9904 Segmental and somatic dysfunction of sacral region: Secondary | ICD-10-CM | POA: Diagnosis not present

## 2020-07-08 DIAGNOSIS — M5441 Lumbago with sciatica, right side: Secondary | ICD-10-CM | POA: Diagnosis not present

## 2020-07-08 DIAGNOSIS — M9904 Segmental and somatic dysfunction of sacral region: Secondary | ICD-10-CM | POA: Diagnosis not present

## 2020-07-08 DIAGNOSIS — M9903 Segmental and somatic dysfunction of lumbar region: Secondary | ICD-10-CM | POA: Diagnosis not present

## 2020-07-08 DIAGNOSIS — M461 Sacroiliitis, not elsewhere classified: Secondary | ICD-10-CM | POA: Diagnosis not present

## 2020-07-17 DIAGNOSIS — M5441 Lumbago with sciatica, right side: Secondary | ICD-10-CM | POA: Diagnosis not present

## 2020-07-17 DIAGNOSIS — M9904 Segmental and somatic dysfunction of sacral region: Secondary | ICD-10-CM | POA: Diagnosis not present

## 2020-07-17 DIAGNOSIS — M461 Sacroiliitis, not elsewhere classified: Secondary | ICD-10-CM | POA: Diagnosis not present

## 2020-07-17 DIAGNOSIS — J209 Acute bronchitis, unspecified: Secondary | ICD-10-CM | POA: Diagnosis not present

## 2020-07-17 DIAGNOSIS — M9903 Segmental and somatic dysfunction of lumbar region: Secondary | ICD-10-CM | POA: Diagnosis not present

## 2020-07-17 DIAGNOSIS — I480 Paroxysmal atrial fibrillation: Secondary | ICD-10-CM | POA: Diagnosis not present

## 2020-07-17 DIAGNOSIS — I10 Essential (primary) hypertension: Secondary | ICD-10-CM | POA: Diagnosis not present

## 2020-07-24 DIAGNOSIS — M461 Sacroiliitis, not elsewhere classified: Secondary | ICD-10-CM | POA: Diagnosis not present

## 2020-07-24 DIAGNOSIS — M9904 Segmental and somatic dysfunction of sacral region: Secondary | ICD-10-CM | POA: Diagnosis not present

## 2020-07-24 DIAGNOSIS — M5441 Lumbago with sciatica, right side: Secondary | ICD-10-CM | POA: Diagnosis not present

## 2020-07-24 DIAGNOSIS — M9903 Segmental and somatic dysfunction of lumbar region: Secondary | ICD-10-CM | POA: Diagnosis not present

## 2020-08-12 DIAGNOSIS — M9903 Segmental and somatic dysfunction of lumbar region: Secondary | ICD-10-CM | POA: Diagnosis not present

## 2020-08-12 DIAGNOSIS — M461 Sacroiliitis, not elsewhere classified: Secondary | ICD-10-CM | POA: Diagnosis not present

## 2020-08-12 DIAGNOSIS — M5441 Lumbago with sciatica, right side: Secondary | ICD-10-CM | POA: Diagnosis not present

## 2020-08-12 DIAGNOSIS — M9904 Segmental and somatic dysfunction of sacral region: Secondary | ICD-10-CM | POA: Diagnosis not present

## 2020-08-14 DIAGNOSIS — M461 Sacroiliitis, not elsewhere classified: Secondary | ICD-10-CM | POA: Diagnosis not present

## 2020-08-14 DIAGNOSIS — M9904 Segmental and somatic dysfunction of sacral region: Secondary | ICD-10-CM | POA: Diagnosis not present

## 2020-08-14 DIAGNOSIS — I10 Essential (primary) hypertension: Secondary | ICD-10-CM | POA: Diagnosis not present

## 2020-08-14 DIAGNOSIS — M5441 Lumbago with sciatica, right side: Secondary | ICD-10-CM | POA: Diagnosis not present

## 2020-08-14 DIAGNOSIS — M9903 Segmental and somatic dysfunction of lumbar region: Secondary | ICD-10-CM | POA: Diagnosis not present

## 2020-08-14 DIAGNOSIS — E785 Hyperlipidemia, unspecified: Secondary | ICD-10-CM | POA: Diagnosis not present

## 2020-08-14 DIAGNOSIS — E538 Deficiency of other specified B group vitamins: Secondary | ICD-10-CM | POA: Diagnosis not present

## 2020-08-26 DIAGNOSIS — E785 Hyperlipidemia, unspecified: Secondary | ICD-10-CM | POA: Diagnosis not present

## 2020-08-26 DIAGNOSIS — K219 Gastro-esophageal reflux disease without esophagitis: Secondary | ICD-10-CM | POA: Diagnosis not present

## 2020-08-26 DIAGNOSIS — G4733 Obstructive sleep apnea (adult) (pediatric): Secondary | ICD-10-CM | POA: Diagnosis not present

## 2020-08-26 DIAGNOSIS — I10 Essential (primary) hypertension: Secondary | ICD-10-CM | POA: Diagnosis not present

## 2020-08-26 DIAGNOSIS — N1831 Chronic kidney disease, stage 3a: Secondary | ICD-10-CM | POA: Diagnosis not present

## 2020-08-26 DIAGNOSIS — F3342 Major depressive disorder, recurrent, in full remission: Secondary | ICD-10-CM | POA: Diagnosis not present

## 2020-08-26 DIAGNOSIS — E538 Deficiency of other specified B group vitamins: Secondary | ICD-10-CM | POA: Diagnosis not present

## 2020-08-26 DIAGNOSIS — I4581 Long QT syndrome: Secondary | ICD-10-CM | POA: Diagnosis not present

## 2020-08-26 DIAGNOSIS — I480 Paroxysmal atrial fibrillation: Secondary | ICD-10-CM | POA: Diagnosis not present

## 2020-09-02 DIAGNOSIS — M461 Sacroiliitis, not elsewhere classified: Secondary | ICD-10-CM | POA: Diagnosis not present

## 2020-09-02 DIAGNOSIS — M5441 Lumbago with sciatica, right side: Secondary | ICD-10-CM | POA: Diagnosis not present

## 2020-09-02 DIAGNOSIS — M9904 Segmental and somatic dysfunction of sacral region: Secondary | ICD-10-CM | POA: Diagnosis not present

## 2020-09-02 DIAGNOSIS — M9903 Segmental and somatic dysfunction of lumbar region: Secondary | ICD-10-CM | POA: Diagnosis not present

## 2020-09-30 DIAGNOSIS — M461 Sacroiliitis, not elsewhere classified: Secondary | ICD-10-CM | POA: Diagnosis not present

## 2020-09-30 DIAGNOSIS — M5441 Lumbago with sciatica, right side: Secondary | ICD-10-CM | POA: Diagnosis not present

## 2020-09-30 DIAGNOSIS — M9904 Segmental and somatic dysfunction of sacral region: Secondary | ICD-10-CM | POA: Diagnosis not present

## 2020-09-30 DIAGNOSIS — M9903 Segmental and somatic dysfunction of lumbar region: Secondary | ICD-10-CM | POA: Diagnosis not present

## 2020-10-07 ENCOUNTER — Other Ambulatory Visit: Payer: Self-pay | Admitting: Internal Medicine

## 2020-10-07 DIAGNOSIS — Z1231 Encounter for screening mammogram for malignant neoplasm of breast: Secondary | ICD-10-CM

## 2020-11-04 DIAGNOSIS — M461 Sacroiliitis, not elsewhere classified: Secondary | ICD-10-CM | POA: Diagnosis not present

## 2020-11-04 DIAGNOSIS — M9903 Segmental and somatic dysfunction of lumbar region: Secondary | ICD-10-CM | POA: Diagnosis not present

## 2020-11-04 DIAGNOSIS — M5441 Lumbago with sciatica, right side: Secondary | ICD-10-CM | POA: Diagnosis not present

## 2020-11-04 DIAGNOSIS — M9904 Segmental and somatic dysfunction of sacral region: Secondary | ICD-10-CM | POA: Diagnosis not present

## 2020-11-11 ENCOUNTER — Ambulatory Visit
Admission: RE | Admit: 2020-11-11 | Discharge: 2020-11-11 | Disposition: A | Discharge status: Home / Self Care | Payer: PPO | Source: Ambulatory Visit | Attending: Internal Medicine | Admitting: Internal Medicine

## 2020-11-11 ENCOUNTER — Other Ambulatory Visit: Payer: Self-pay

## 2020-11-11 DIAGNOSIS — Z1231 Encounter for screening mammogram for malignant neoplasm of breast: Secondary | ICD-10-CM

## 2020-12-02 DIAGNOSIS — M9903 Segmental and somatic dysfunction of lumbar region: Secondary | ICD-10-CM | POA: Diagnosis not present

## 2020-12-02 DIAGNOSIS — M461 Sacroiliitis, not elsewhere classified: Secondary | ICD-10-CM | POA: Diagnosis not present

## 2020-12-02 DIAGNOSIS — M5441 Lumbago with sciatica, right side: Secondary | ICD-10-CM | POA: Diagnosis not present

## 2020-12-02 DIAGNOSIS — M9904 Segmental and somatic dysfunction of sacral region: Secondary | ICD-10-CM | POA: Diagnosis not present

## 2020-12-04 ENCOUNTER — Ambulatory Visit: Payer: PPO | Admitting: Internal Medicine

## 2021-01-21 DIAGNOSIS — H2512 Age-related nuclear cataract, left eye: Secondary | ICD-10-CM | POA: Diagnosis not present

## 2021-02-12 ENCOUNTER — Ambulatory Visit: Payer: PPO | Admitting: Dermatology

## 2021-02-12 ENCOUNTER — Other Ambulatory Visit: Payer: Self-pay

## 2021-02-12 DIAGNOSIS — Z1283 Encounter for screening for malignant neoplasm of skin: Secondary | ICD-10-CM | POA: Diagnosis not present

## 2021-02-12 DIAGNOSIS — L578 Other skin changes due to chronic exposure to nonionizing radiation: Secondary | ICD-10-CM

## 2021-02-12 DIAGNOSIS — L814 Other melanin hyperpigmentation: Secondary | ICD-10-CM | POA: Diagnosis not present

## 2021-02-12 DIAGNOSIS — D485 Neoplasm of uncertain behavior of skin: Secondary | ICD-10-CM

## 2021-02-12 DIAGNOSIS — Z85828 Personal history of other malignant neoplasm of skin: Secondary | ICD-10-CM | POA: Diagnosis not present

## 2021-02-12 DIAGNOSIS — D229 Melanocytic nevi, unspecified: Secondary | ICD-10-CM | POA: Diagnosis not present

## 2021-02-12 DIAGNOSIS — L820 Inflamed seborrheic keratosis: Secondary | ICD-10-CM | POA: Diagnosis not present

## 2021-02-12 DIAGNOSIS — L821 Other seborrheic keratosis: Secondary | ICD-10-CM | POA: Diagnosis not present

## 2021-02-12 DIAGNOSIS — D1800 Hemangioma unspecified site: Secondary | ICD-10-CM | POA: Diagnosis not present

## 2021-02-12 NOTE — Progress Notes (Signed)
Follow-Up Visit   Subjective  Christina Underwood is a 75 y.o. female who presents for the following: Annual Exam (History of BCC - TBSE today). The patient presents for Total-Body Skin Exam (TBSE) for skin cancer screening and mole check.  The following portions of the chart were reviewed this encounter and updated as appropriate:   Tobacco  Allergies  Meds  Problems  Med Hx  Surg Hx  Fam Hx     Review of Systems:  No other skin or systemic complaints except as noted in HPI or Assessment and Plan.  Objective  Well appearing patient in no apparent distress; mood and affect are within normal limits.  A full examination was performed including scalp, head, eyes, ears, nose, lips, neck, chest, axillae, abdomen, back, buttocks, bilateral upper extremities, bilateral lower extremities, hands, feet, fingers, toes, fingernails, and toenails. All findings within normal limits unless otherwise noted below.  Objective  Left upper eyelid: 0.5 cm flesh colored papule   Assessment & Plan    History of Basal Cell Carcinoma of the Skin - No evidence of recurrence today - Recommend regular full body skin exams - Recommend daily broad spectrum sunscreen SPF 30+ to sun-exposed areas, reapply every 2 hours as needed.  - Call if any new or changing lesions are noted between office visits  Lentigines - Scattered tan macules - Due to sun exposure - Benign-appering, observe - Recommend daily broad spectrum sunscreen SPF 30+ to sun-exposed areas, reapply every 2 hours as needed. - Call for any changes  Seborrheic Keratoses - Stuck-on, waxy, tan-brown papules and plaques  - Discussed benign etiology and prognosis. - Observe - Call for any changes  Melanocytic Nevi - Tan-brown and/or pink-flesh-colored symmetric macules and papules - Benign appearing on exam today - Observation - Call clinic for new or changing moles - Recommend daily use of broad spectrum spf 30+ sunscreen to  sun-exposed areas.   Hemangiomas - Red papules - Discussed benign nature - Observe - Call for any changes  Actinic Damage - Chronic, secondary to cumulative UV/sun exposure - diffuse scaly erythematous macules with underlying dyspigmentation - Recommend daily broad spectrum sunscreen SPF 30+ to sun-exposed areas, reapply every 2 hours as needed.  - Call for new or changing lesions.  Skin cancer screening performed today.  Neoplasm of uncertain behavior of skin Left upper eyelid  Skin excision  Lesion length (cm):  0.5 Lesion width (cm):  0.5 Margin per side (cm):  0.2 Total excision diameter (cm):  0.9 Informed consent: discussed and consent obtained   Timeout: patient name, date of birth, surgical site, and procedure verified   Procedure prep:  Patient was prepped and draped in usual sterile fashion Prep type:  Isopropyl alcohol and povidone-iodine Anesthesia: the lesion was anesthetized in a standard fashion   Anesthetic:  1% lidocaine w/ epinephrine 1-100,000 buffered w/ 8.4% NaHCO3 Instrument used: scissors   Hemostasis achieved with: pressure   Hemostasis achieved with comment:  Electrocautery Outcome: patient tolerated procedure well with no complications   Post-procedure details: sterile dressing applied and wound care instructions given   Dressing type: bandage and pressure dressing (mupirocin)    Specimen 1 - Surgical pathology Differential Diagnosis: SK vs Nevus vs other R/O dysplasia Check Margins: No 0.5 cm flesh colored papule  Skin cancer screening  Return in about 1 year (around 02/12/2022) for TBSE.  I, Ashok Cordia, CMA, am acting as scribe for Sarina Ser, MD .  Documentation: I have reviewed the above documentation  for accuracy and completeness, and I agree with the above.  Sarina Ser, MD

## 2021-02-12 NOTE — Patient Instructions (Addendum)
Wound Care Instructions  1. Cleanse wound gently with soap and water once a day then pat dry with clean gauze. Apply a thing coat of Petrolatum (petroleum jelly, "Vaseline") over the wound (unless you have an allergy to this). We recommend that you use a new, sterile tube of Vaseline. Do not pick or remove scabs. Do not remove the yellow or white "healing tissue" from the base of the wound.  2. Cover the wound with fresh, clean, nonstick gauze and secure with paper tape. You may use Band-Aids in place of gauze and tape if the would is small enough, but would recommend trimming much of the tape off as there is often too much. Sometimes Band-Aids can irritate the skin.  3. You should call the office for your biopsy report after 1 week if you have not already been contacted.  4. If you experience any problems, such as abnormal amounts of bleeding, swelling, significant bruising, significant pain, or evidence of infection, please call the office immediately.  5. FOR ADULT SURGERY PATIENTS: If you need something for pain relief you may take 1 extra strength Tylenol (acetaminophen) AND 2 Ibuprofen (200mg  each) together every 4 hours as needed for pain. (do not take these if you are allergic to them or if you have a reason you should not take them.) Typically, you may only need pain medication for 1 to 3 days.    [place]

## 2021-02-14 ENCOUNTER — Encounter: Payer: Self-pay | Admitting: Dermatology

## 2021-02-17 ENCOUNTER — Telehealth: Payer: Self-pay

## 2021-02-17 NOTE — Telephone Encounter (Signed)
Left message for patient to call office for results/hd 

## 2021-02-17 NOTE — Telephone Encounter (Signed)
-----   Message from Ralene Bathe, MD sent at 02/14/2021 11:18 AM EST ----- Diagnosis Skin , left upper eyelid SEBORRHEIC KERATOSIS, IRRITATED  Benign irritated keratosis No further treatment needed.

## 2021-02-17 NOTE — Telephone Encounter (Signed)
Patient advised of biopsy results.

## 2021-02-23 DIAGNOSIS — E785 Hyperlipidemia, unspecified: Secondary | ICD-10-CM | POA: Diagnosis not present

## 2021-02-23 DIAGNOSIS — E538 Deficiency of other specified B group vitamins: Secondary | ICD-10-CM | POA: Diagnosis not present

## 2021-02-23 DIAGNOSIS — N1831 Chronic kidney disease, stage 3a: Secondary | ICD-10-CM | POA: Diagnosis not present

## 2021-02-28 IMAGING — MR MR LUMBAR SPINE W/O CM
4 of 5 series · 33 of 48 positions shown · non-contrast
Comparison: None.

CLINICAL DATA: Low back pain with right-sided sciatica.

EXAM:
MRI LUMBAR SPINE WITHOUT CONTRAST
TECHNIQUE: Multiplanar, multisequence MR imaging of the lumbar spine was
performed. No intravenous contrast was administered.

[Series 5: T2 · sagittal · 4.0mm · 0.81mm/px · 8 of 17 slices shown (1 of 2)]
[im 1/17]
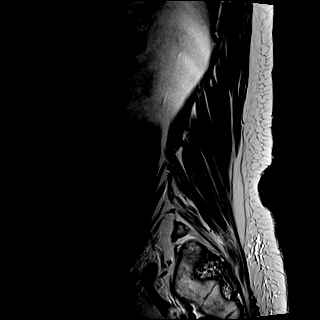
[im 3/17]
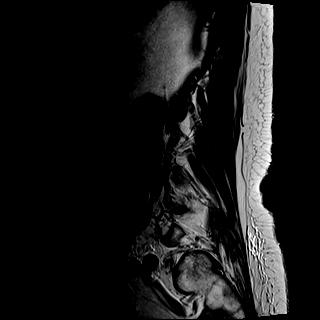
[im 5/17]
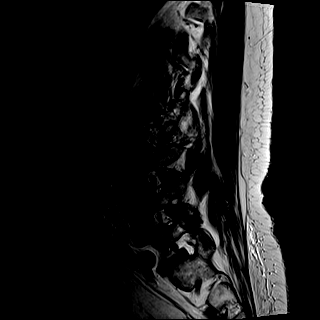
[im 7/17]
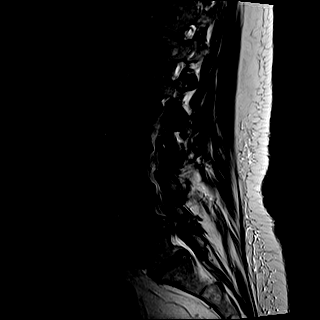
[im 10/17]
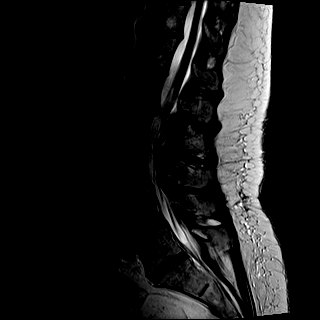
[im 12/17]
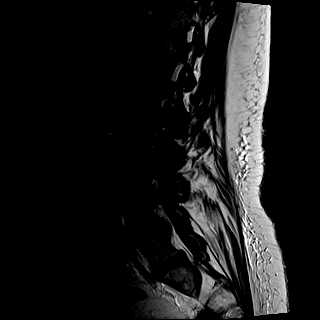
[im 14/17]
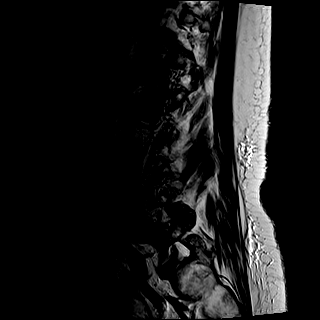
[im 17/17]
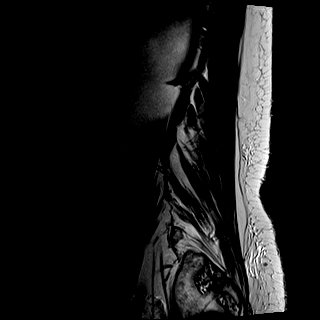

[Series 6: T1 · sagittal · 4.0mm · 0.81mm/px · 7 of 17 slices shown (1 of 2)]
[im 1/17]
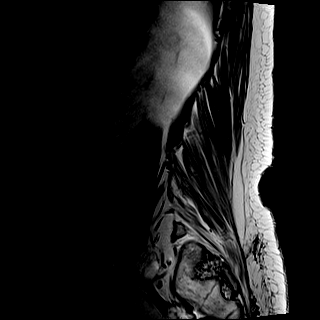
[im 3/17]
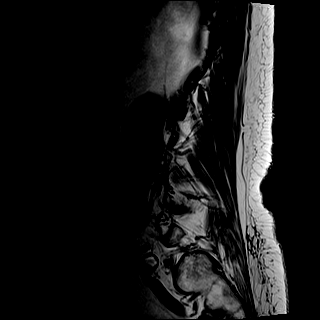
[im 6/17]
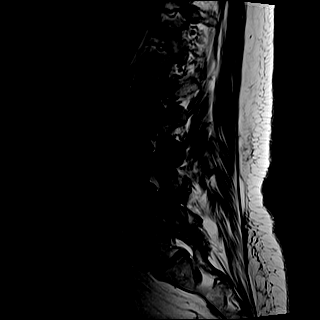
[im 9/17]
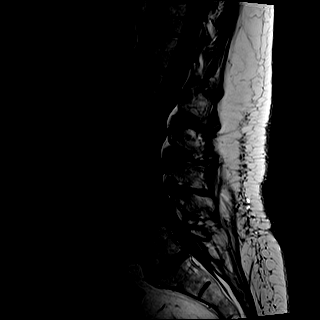
[im 11/17]
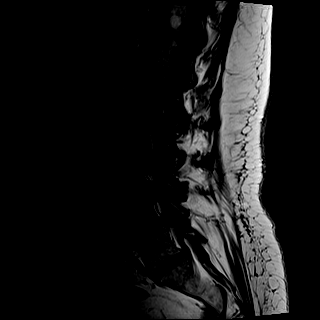
[im 14/17]
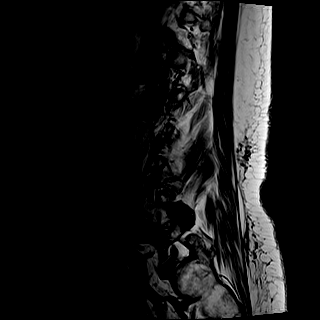
[im 17/17]
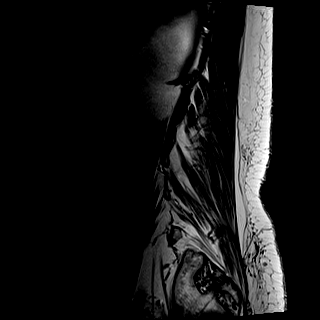

[Series 8: T2 · axial · 4.0mm · 0.78mm/px · z∈[-103,+110]mm · 9 of 32 slices shown (2 of 2)]
[im 1/32]
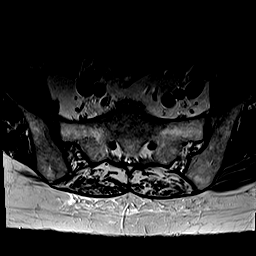
[im 6/32]
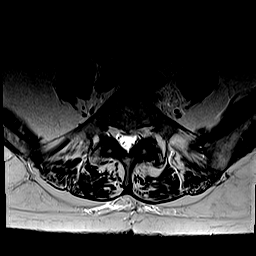
[im 11/32]
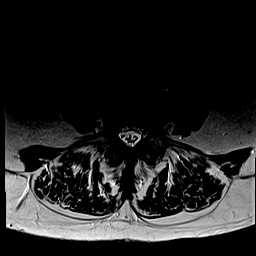
[im 13/32]
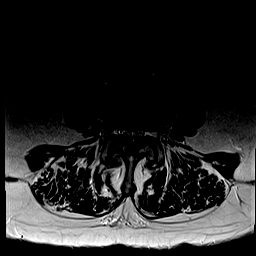
[im 16/32]
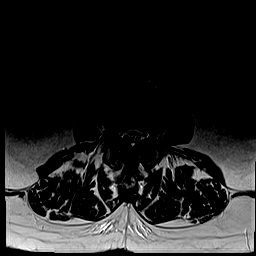
[im 19/32]
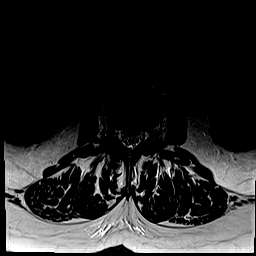
[im 21/32]
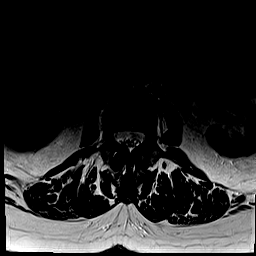
[im 26/32]
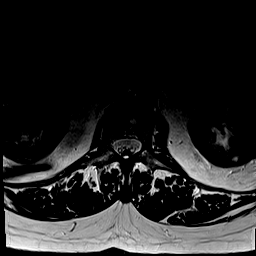
[im 32/32]
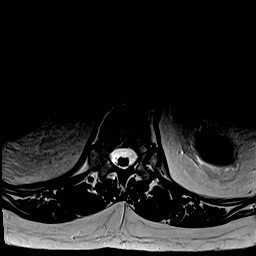

[Series 9: T1 · axial · 4.0mm · 0.39mm/px · z∈[-103,+110]mm · 9 of 32 slices shown (2 of 2)]
[im 1/32]
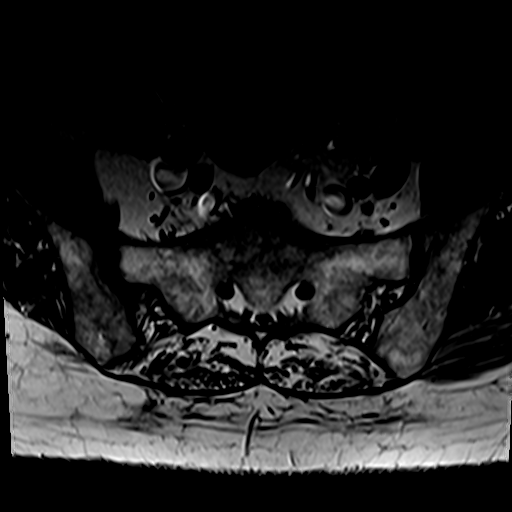
[im 6/32]
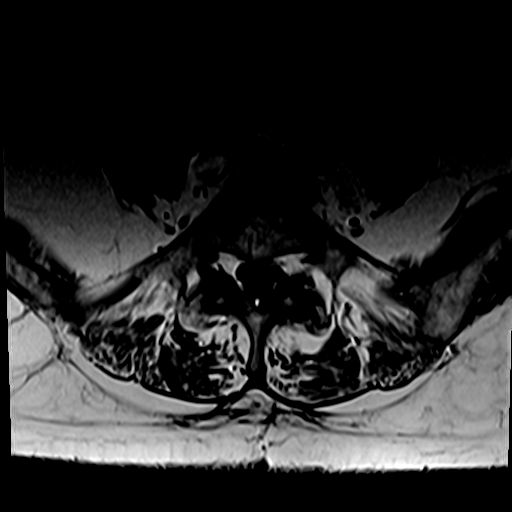
[im 11/32]
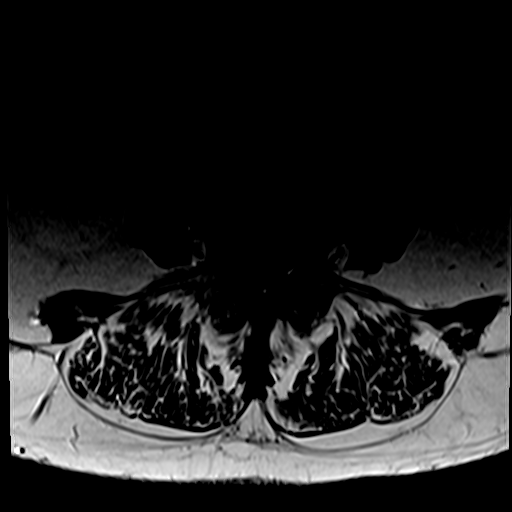
[im 13/32]
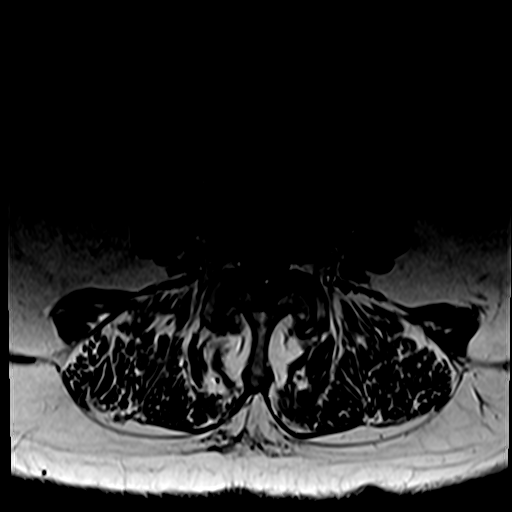
[im 16/32]
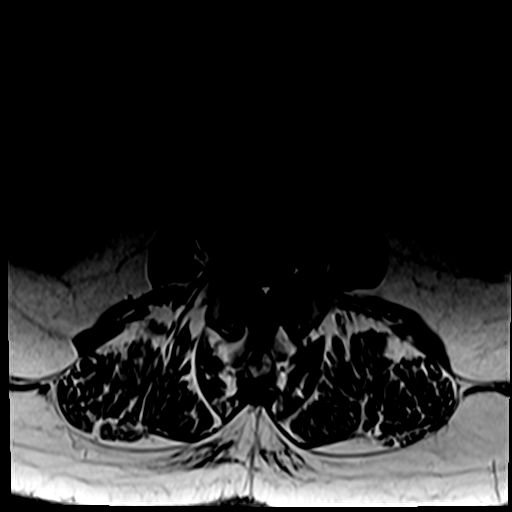
[im 19/32]
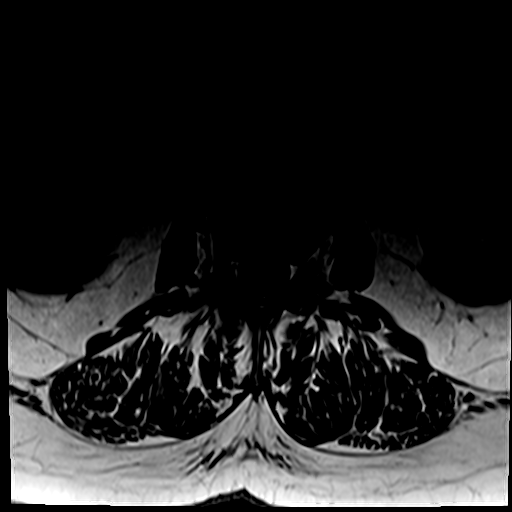
[im 21/32]
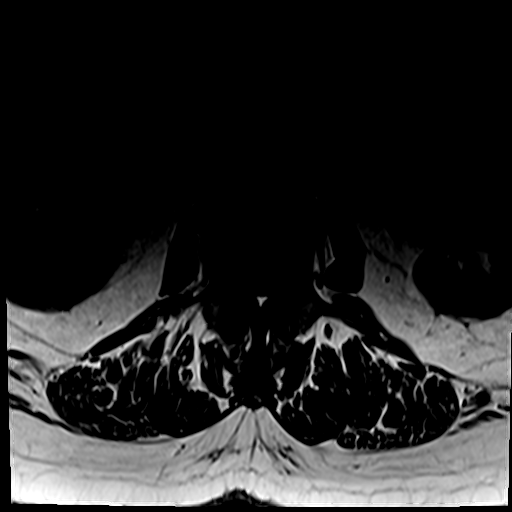
[im 26/32]
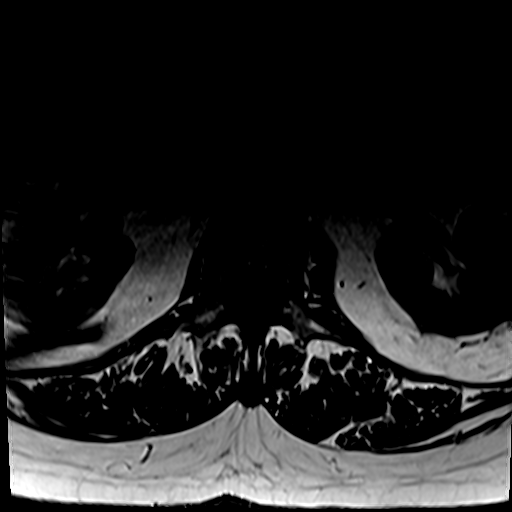
[im 32/32]
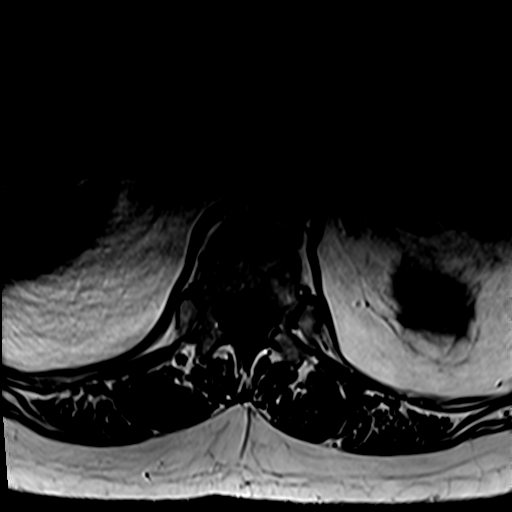

[33 of 48 positions shown; findings below may reference images not displayed]

FINDINGS: Segmentation:  Standard.

Alignment:  Grade 1 anterolisthesis of L4 over L5.

Vertebrae:  No fracture, evidence of discitis, or bone lesion.

Conus medullaris and cauda equina: Conus extends to the L1 level.
Conus and cauda equina appear normal.

Paraspinal and other soft tissues: Negative.

Disc levels:

T12-L1: Shallow disc bulge. No spinal canal or neural foraminal
stenosis.

L1-2: Disc bulge, mild facet degenerative changes and ligamentum
flavum redundancy resulting in mild-to-moderate bilateral neural
foraminal narrowing. No significant spinal canal stenosis.

L2-3: Disc bulge, moderate facet degenerative changes ligamentum
flavum redundancy resulting in mild bilateral neural foraminal
narrowing. No significant spinal canal stenosis.

L3-4: Disc bulge, prominent facet degenerative changes and
ligamentum flavum redundancy resulting in mild spinal canal stenosis
and mild left neural foraminal narrowing.

L4-5: Shallow disc bulge/disc uncovering and prominent facet
degenerative changes, left greater than right without significant
spinal canal or neural foraminal stenosis.

L5-S1: Left foraminal disc osteophyte complex resulting in mild left
neural foraminal narrowing. There also moderate facet degenerative
changes. No significant spinal canal stenosis.
IMPRESSION: 1. Lumbar spondylosis as described more significant at the level of
the facet joints. Findings are most pronounced at L3-4 where there
is mild spinal canal stenosis and mild left neural foraminal
narrowing.
2. Mild-to-moderate bilateral neural foraminal narrowing at L1-2.

## 2021-03-02 DIAGNOSIS — N1831 Chronic kidney disease, stage 3a: Secondary | ICD-10-CM | POA: Diagnosis not present

## 2021-03-02 DIAGNOSIS — I4891 Unspecified atrial fibrillation: Secondary | ICD-10-CM | POA: Diagnosis not present

## 2021-03-02 DIAGNOSIS — I480 Paroxysmal atrial fibrillation: Secondary | ICD-10-CM | POA: Diagnosis not present

## 2021-03-02 DIAGNOSIS — G4733 Obstructive sleep apnea (adult) (pediatric): Secondary | ICD-10-CM | POA: Diagnosis not present

## 2021-03-02 DIAGNOSIS — F3342 Major depressive disorder, recurrent, in full remission: Secondary | ICD-10-CM | POA: Diagnosis not present

## 2021-03-02 DIAGNOSIS — Z0000 Encounter for general adult medical examination without abnormal findings: Secondary | ICD-10-CM | POA: Diagnosis not present

## 2021-03-02 DIAGNOSIS — E538 Deficiency of other specified B group vitamins: Secondary | ICD-10-CM | POA: Diagnosis not present

## 2021-03-02 DIAGNOSIS — K219 Gastro-esophageal reflux disease without esophagitis: Secondary | ICD-10-CM | POA: Diagnosis not present

## 2021-03-02 DIAGNOSIS — E785 Hyperlipidemia, unspecified: Secondary | ICD-10-CM | POA: Diagnosis not present

## 2021-03-02 DIAGNOSIS — I10 Essential (primary) hypertension: Secondary | ICD-10-CM | POA: Diagnosis not present

## 2021-03-02 DIAGNOSIS — J9811 Atelectasis: Secondary | ICD-10-CM | POA: Diagnosis not present

## 2021-03-02 DIAGNOSIS — Z79899 Other long term (current) drug therapy: Secondary | ICD-10-CM | POA: Diagnosis not present

## 2021-03-12 ENCOUNTER — Encounter: Payer: Self-pay | Admitting: Internal Medicine

## 2021-03-12 ENCOUNTER — Other Ambulatory Visit: Payer: Self-pay

## 2021-03-12 ENCOUNTER — Ambulatory Visit: Payer: PPO | Admitting: Internal Medicine

## 2021-03-12 VITALS — BP 136/62 | HR 56 | Ht 64.0 in | Wt 206.0 lb

## 2021-03-12 DIAGNOSIS — I493 Ventricular premature depolarization: Secondary | ICD-10-CM | POA: Diagnosis not present

## 2021-03-12 DIAGNOSIS — I491 Atrial premature depolarization: Secondary | ICD-10-CM

## 2021-03-12 DIAGNOSIS — Z79899 Other long term (current) drug therapy: Secondary | ICD-10-CM | POA: Diagnosis not present

## 2021-03-12 NOTE — Progress Notes (Signed)
Patient Care Team: Adin Hector, MD as PCP - General (Internal Medicine)   HPI  Christina Underwood is a 75 y.o. female In follow-up of chest pain.She has symptomatic PACs and PVCs. She was treated with amiodarone .she has been on low-dose therapy for some years, taking 100 mg 3 days a week. Surveillance laboratories have been followed by Dr. Roney Marion.   BP well controlled at home 110-120s   Not vaccinated-- COVID 1 yr ago  The patient denies chest pain, shortness of breath, nocturnal dyspnea, orthopnea or peripheral edema.  There have been no palpitations, lightheadedness or syncope.   Patient denies symptoms of GI intolerance, sun sensitivity, neurological symptoms attributable to amiodarone.     DATE TEST EF   2008 Cath  No CAD  1/16 Myoview  75 % No ischemia  10/19 Myoview 75% No Ischemia I    Date Cr K TSH LFT  2/19   2.92 15  8/19    2.72 17  2/21 1.0 4.2 1.597 20  3/22 1.1 4.1 3.04 31                Past Medical History:  Diagnosis Date  . Abdominal discomfort    R upper quad  . Actinic keratosis   . Atypical chest pain   . Basal cell carcinoma 10/24/2019   Left medial cheek/lateral to nose 3cm inferior to left medial canthus. Nodular pattern. Excised 12/04/2019, margins free.  . Breast lump   . CAD (coronary artery disease)    25% RCA 2008   . Depression   . GERD (gastroesophageal reflux disease)   . HLD (hyperlipidemia)   . HTN (hypertension)   . Hypokalemia   . Long QT interval    assoc with amiodarone  . PAC (premature atrial contraction)   . Right ventricular outflow tract premature ventricular contractions (PVCs)   . Sleep apnea     Past Surgical History:  Procedure Laterality Date  . APPENDECTOMY    . bunion repairs     bilateral   . CARDIAC CATHETERIZATION     [facility]  . CARDIAC CATHETERIZATION     [facility].  Marland Kitchen CATARACT EXTRACTION Right   . COLONOSCOPY    . lumbar laminextomy  2001  . TOTAL ABDOMINAL HYSTERECTOMY W/  BILATERAL SALPINGOOPHORECTOMY    . VESICOVAGINAL FISTULA CLOSURE W/ TAH      Current Outpatient Medications  Medication Sig Dispense Refill  . amiodarone (PACERONE) 200 MG tablet Take 1 tablet (200 mg) by mouth 5 days a week    . aspirin 81 MG EC tablet Take 81 mg by mouth daily.    Marland Kitchen atorvastatin (LIPITOR) 40 MG tablet Take 40 mg by mouth at bedtime.    . calcium carbonate (OS-CAL) 600 MG TABS tablet Take 600 mg by mouth daily with breakfast.    . cetirizine (ZYRTEC) 10 MG tablet Take 10 mg by mouth daily.    . citalopram (CELEXA) 10 MG tablet Take 10 mg by mouth daily.    Marland Kitchen escitalopram (LEXAPRO) 10 MG tablet Take 10 mg by mouth at bedtime.     . gabapentin (NEURONTIN) 300 MG capsule TAKE 3 CAPSULES BY MOUTH 90 MINUTES PRIOR TO BEDTIME    . pantoprazole (PROTONIX) 40 MG tablet TAKE 1 TABLET(40 MG) BY MOUTH EVERY DAY    . valsartan-hydrochlorothiazide (DIOVAN-HCT) 320-25 MG per tablet Take 1 tablet by mouth daily.      No current facility-administered medications for this visit.  Allergies  Allergen Reactions  . Sulfonamide Derivatives   . Sulfa Antibiotics Itching and Rash    Review of Systems negative except from HPI and PMH  Physical Exam BP 136/62 (BP Location: Left Arm, Patient Position: Sitting, Cuff Size: Normal)   Pulse (!) 56   Ht 5\' 4"  (1.626 m)   Wt 206 lb (93.4 kg)   SpO2 97%   BMI 35.36 kg/m  Well developed and nourished in no acute distress HENT normal Neck supple with JVP-  flat   Clear Regular rate and rhythm, no murmurs or gallops Abd-soft with active BS No Clubbing cyanosis edema Skin-warm and dry A & Oriented  Grossly normal sensory and motor function  ECG sinus at 56 Interval 41/07/45  Assessment and  Plan  PACs/PVCs-symptomatic  Amiodarone therapy-high risk  Cardiomyopathy recovered  Sinus bradycardia  Hypertension  Non vaccinator   Ectopy quiescient on amiodarone.  Will decrease dose further from 1000--700 mg a week.  Tolerating.   Surveillance laboratories within normal limits  Euvolemic.  Blood pressure well controlled  Sinus bradycardia seems to be asymptomatic, and may further improve with the down titration of her amiodarone

## 2021-03-12 NOTE — Patient Instructions (Addendum)
Medication Instructions:   1. Your physician has recommended you make the following change in your medication:   - CHANGE Amiodarone - Take half tablet (100mg ) by mouth daily.   *If you need a refill on your cardiac medications before your next appointment, please call your pharmacy*   Lab Work:  1. None ordered  If you have labs (blood work) drawn today and your tests are completely normal, you will receive your results only by: [facility]land Kitchen MyChart Message (if you have MyChart) OR . A paper copy in the mail If you have any lab test that is abnormal or we need to change your treatment, we will call you to review the results.   Testing/Procedures:  1. None Ordered   Follow-Up: At [facility], you and your health needs are our priority.  As part of our continuing mission to provide you with exceptional heart care, we have created designated Provider Care Teams.  These Care Teams include your primary Cardiologist (physician) and Advanced Practice Providers (APPs -  Physician Assistants and Nurse Practitioners) who all work together to provide you with the care you need, when you need it.  We recommend signing up for the patient portal called "MyChart".  Sign up information is provided on this After Visit Summary.  MyChart is used to connect with patients for Virtual Visits (Telemedicine).  Patients are able to view lab/test results, encounter notes, upcoming appointments, etc.  Non-urgent messages can be sent to your provider as well.   To learn more about what you can do with MyChart, go to [email or web address].    Your next appointment:   6 month(s)  The format for your next appointment:   In Person  Provider:   Virl Axe, MD

## 2021-03-14 DIAGNOSIS — M461 Sacroiliitis, not elsewhere classified: Secondary | ICD-10-CM | POA: Diagnosis not present

## 2021-03-14 DIAGNOSIS — M9903 Segmental and somatic dysfunction of lumbar region: Secondary | ICD-10-CM | POA: Diagnosis not present

## 2021-03-14 DIAGNOSIS — M5441 Lumbago with sciatica, right side: Secondary | ICD-10-CM | POA: Diagnosis not present

## 2021-03-14 DIAGNOSIS — M9904 Segmental and somatic dysfunction of sacral region: Secondary | ICD-10-CM | POA: Diagnosis not present

## 2021-03-16 DIAGNOSIS — M5441 Lumbago with sciatica, right side: Secondary | ICD-10-CM | POA: Diagnosis not present

## 2021-03-16 DIAGNOSIS — M9903 Segmental and somatic dysfunction of lumbar region: Secondary | ICD-10-CM | POA: Diagnosis not present

## 2021-03-16 DIAGNOSIS — M461 Sacroiliitis, not elsewhere classified: Secondary | ICD-10-CM | POA: Diagnosis not present

## 2021-03-16 DIAGNOSIS — M9904 Segmental and somatic dysfunction of sacral region: Secondary | ICD-10-CM | POA: Diagnosis not present

## 2021-03-17 DIAGNOSIS — M461 Sacroiliitis, not elsewhere classified: Secondary | ICD-10-CM | POA: Diagnosis not present

## 2021-03-17 DIAGNOSIS — M9903 Segmental and somatic dysfunction of lumbar region: Secondary | ICD-10-CM | POA: Diagnosis not present

## 2021-03-17 DIAGNOSIS — M5441 Lumbago with sciatica, right side: Secondary | ICD-10-CM | POA: Diagnosis not present

## 2021-03-17 DIAGNOSIS — M9904 Segmental and somatic dysfunction of sacral region: Secondary | ICD-10-CM | POA: Diagnosis not present

## 2021-03-19 DIAGNOSIS — M5441 Lumbago with sciatica, right side: Secondary | ICD-10-CM | POA: Diagnosis not present

## 2021-03-19 DIAGNOSIS — M461 Sacroiliitis, not elsewhere classified: Secondary | ICD-10-CM | POA: Diagnosis not present

## 2021-03-19 DIAGNOSIS — M9904 Segmental and somatic dysfunction of sacral region: Secondary | ICD-10-CM | POA: Diagnosis not present

## 2021-03-19 DIAGNOSIS — M9903 Segmental and somatic dysfunction of lumbar region: Secondary | ICD-10-CM | POA: Diagnosis not present

## 2021-03-23 DIAGNOSIS — M9904 Segmental and somatic dysfunction of sacral region: Secondary | ICD-10-CM | POA: Diagnosis not present

## 2021-03-23 DIAGNOSIS — M461 Sacroiliitis, not elsewhere classified: Secondary | ICD-10-CM | POA: Diagnosis not present

## 2021-03-23 DIAGNOSIS — M5441 Lumbago with sciatica, right side: Secondary | ICD-10-CM | POA: Diagnosis not present

## 2021-03-23 DIAGNOSIS — M9903 Segmental and somatic dysfunction of lumbar region: Secondary | ICD-10-CM | POA: Diagnosis not present

## 2021-03-26 DIAGNOSIS — M5441 Lumbago with sciatica, right side: Secondary | ICD-10-CM | POA: Diagnosis not present

## 2021-03-26 DIAGNOSIS — M9904 Segmental and somatic dysfunction of sacral region: Secondary | ICD-10-CM | POA: Diagnosis not present

## 2021-03-26 DIAGNOSIS — M9903 Segmental and somatic dysfunction of lumbar region: Secondary | ICD-10-CM | POA: Diagnosis not present

## 2021-03-26 DIAGNOSIS — M461 Sacroiliitis, not elsewhere classified: Secondary | ICD-10-CM | POA: Diagnosis not present

## 2021-03-30 DIAGNOSIS — M9903 Segmental and somatic dysfunction of lumbar region: Secondary | ICD-10-CM | POA: Diagnosis not present

## 2021-03-30 DIAGNOSIS — M461 Sacroiliitis, not elsewhere classified: Secondary | ICD-10-CM | POA: Diagnosis not present

## 2021-03-30 DIAGNOSIS — M5441 Lumbago with sciatica, right side: Secondary | ICD-10-CM | POA: Diagnosis not present

## 2021-03-30 DIAGNOSIS — M9904 Segmental and somatic dysfunction of sacral region: Secondary | ICD-10-CM | POA: Diagnosis not present

## 2021-04-02 DIAGNOSIS — M5441 Lumbago with sciatica, right side: Secondary | ICD-10-CM | POA: Diagnosis not present

## 2021-04-02 DIAGNOSIS — M9904 Segmental and somatic dysfunction of sacral region: Secondary | ICD-10-CM | POA: Diagnosis not present

## 2021-04-02 DIAGNOSIS — M9903 Segmental and somatic dysfunction of lumbar region: Secondary | ICD-10-CM | POA: Diagnosis not present

## 2021-04-02 DIAGNOSIS — M461 Sacroiliitis, not elsewhere classified: Secondary | ICD-10-CM | POA: Diagnosis not present

## 2021-04-06 DIAGNOSIS — M461 Sacroiliitis, not elsewhere classified: Secondary | ICD-10-CM | POA: Diagnosis not present

## 2021-04-06 DIAGNOSIS — M9904 Segmental and somatic dysfunction of sacral region: Secondary | ICD-10-CM | POA: Diagnosis not present

## 2021-04-06 DIAGNOSIS — M5441 Lumbago with sciatica, right side: Secondary | ICD-10-CM | POA: Diagnosis not present

## 2021-04-06 DIAGNOSIS — M9903 Segmental and somatic dysfunction of lumbar region: Secondary | ICD-10-CM | POA: Diagnosis not present

## 2021-04-09 DIAGNOSIS — M9904 Segmental and somatic dysfunction of sacral region: Secondary | ICD-10-CM | POA: Diagnosis not present

## 2021-04-09 DIAGNOSIS — M5441 Lumbago with sciatica, right side: Secondary | ICD-10-CM | POA: Diagnosis not present

## 2021-04-09 DIAGNOSIS — M9903 Segmental and somatic dysfunction of lumbar region: Secondary | ICD-10-CM | POA: Diagnosis not present

## 2021-04-09 DIAGNOSIS — M461 Sacroiliitis, not elsewhere classified: Secondary | ICD-10-CM | POA: Diagnosis not present

## 2021-04-13 DIAGNOSIS — M5441 Lumbago with sciatica, right side: Secondary | ICD-10-CM | POA: Diagnosis not present

## 2021-04-13 DIAGNOSIS — M461 Sacroiliitis, not elsewhere classified: Secondary | ICD-10-CM | POA: Diagnosis not present

## 2021-04-13 DIAGNOSIS — M9904 Segmental and somatic dysfunction of sacral region: Secondary | ICD-10-CM | POA: Diagnosis not present

## 2021-04-13 DIAGNOSIS — M9903 Segmental and somatic dysfunction of lumbar region: Secondary | ICD-10-CM | POA: Diagnosis not present

## 2021-04-20 DIAGNOSIS — M9904 Segmental and somatic dysfunction of sacral region: Secondary | ICD-10-CM | POA: Diagnosis not present

## 2021-04-20 DIAGNOSIS — M9903 Segmental and somatic dysfunction of lumbar region: Secondary | ICD-10-CM | POA: Diagnosis not present

## 2021-04-20 DIAGNOSIS — M5441 Lumbago with sciatica, right side: Secondary | ICD-10-CM | POA: Diagnosis not present

## 2021-04-20 DIAGNOSIS — M461 Sacroiliitis, not elsewhere classified: Secondary | ICD-10-CM | POA: Diagnosis not present

## 2021-05-04 DIAGNOSIS — M9903 Segmental and somatic dysfunction of lumbar region: Secondary | ICD-10-CM | POA: Diagnosis not present

## 2021-05-04 DIAGNOSIS — M9904 Segmental and somatic dysfunction of sacral region: Secondary | ICD-10-CM | POA: Diagnosis not present

## 2021-05-04 DIAGNOSIS — M461 Sacroiliitis, not elsewhere classified: Secondary | ICD-10-CM | POA: Diagnosis not present

## 2021-05-04 DIAGNOSIS — M5441 Lumbago with sciatica, right side: Secondary | ICD-10-CM | POA: Diagnosis not present

## 2021-08-26 DIAGNOSIS — H2000 Unspecified acute and subacute iridocyclitis: Secondary | ICD-10-CM | POA: Diagnosis not present

## 2021-08-27 DIAGNOSIS — I10 Essential (primary) hypertension: Secondary | ICD-10-CM | POA: Diagnosis not present

## 2021-08-27 DIAGNOSIS — E785 Hyperlipidemia, unspecified: Secondary | ICD-10-CM | POA: Diagnosis not present

## 2021-08-27 DIAGNOSIS — N1831 Chronic kidney disease, stage 3a: Secondary | ICD-10-CM | POA: Diagnosis not present

## 2021-08-27 DIAGNOSIS — I480 Paroxysmal atrial fibrillation: Secondary | ICD-10-CM | POA: Diagnosis not present

## 2021-09-03 DIAGNOSIS — M519 Unspecified thoracic, thoracolumbar and lumbosacral intervertebral disc disorder: Secondary | ICD-10-CM | POA: Diagnosis not present

## 2021-09-03 DIAGNOSIS — E785 Hyperlipidemia, unspecified: Secondary | ICD-10-CM | POA: Diagnosis not present

## 2021-09-03 DIAGNOSIS — N1831 Chronic kidney disease, stage 3a: Secondary | ICD-10-CM | POA: Diagnosis not present

## 2021-09-03 DIAGNOSIS — E538 Deficiency of other specified B group vitamins: Secondary | ICD-10-CM | POA: Diagnosis not present

## 2021-09-03 DIAGNOSIS — F3342 Major depressive disorder, recurrent, in full remission: Secondary | ICD-10-CM | POA: Diagnosis not present

## 2021-09-03 DIAGNOSIS — G4733 Obstructive sleep apnea (adult) (pediatric): Secondary | ICD-10-CM | POA: Diagnosis not present

## 2021-09-03 DIAGNOSIS — I480 Paroxysmal atrial fibrillation: Secondary | ICD-10-CM | POA: Diagnosis not present

## 2021-09-03 DIAGNOSIS — I10 Essential (primary) hypertension: Secondary | ICD-10-CM | POA: Diagnosis not present

## 2021-09-03 DIAGNOSIS — R748 Abnormal levels of other serum enzymes: Secondary | ICD-10-CM | POA: Diagnosis not present

## 2021-09-03 DIAGNOSIS — K219 Gastro-esophageal reflux disease without esophagitis: Secondary | ICD-10-CM | POA: Diagnosis not present

## 2021-09-03 DIAGNOSIS — I4581 Long QT syndrome: Secondary | ICD-10-CM | POA: Diagnosis not present

## 2021-09-07 DIAGNOSIS — M9904 Segmental and somatic dysfunction of sacral region: Secondary | ICD-10-CM | POA: Diagnosis not present

## 2021-09-07 DIAGNOSIS — M5442 Lumbago with sciatica, left side: Secondary | ICD-10-CM | POA: Diagnosis not present

## 2021-09-07 DIAGNOSIS — M9903 Segmental and somatic dysfunction of lumbar region: Secondary | ICD-10-CM | POA: Diagnosis not present

## 2021-09-07 DIAGNOSIS — M461 Sacroiliitis, not elsewhere classified: Secondary | ICD-10-CM | POA: Diagnosis not present

## 2021-09-08 DIAGNOSIS — M461 Sacroiliitis, not elsewhere classified: Secondary | ICD-10-CM | POA: Diagnosis not present

## 2021-09-08 DIAGNOSIS — M5442 Lumbago with sciatica, left side: Secondary | ICD-10-CM | POA: Diagnosis not present

## 2021-09-08 DIAGNOSIS — M9904 Segmental and somatic dysfunction of sacral region: Secondary | ICD-10-CM | POA: Diagnosis not present

## 2021-09-08 DIAGNOSIS — M9903 Segmental and somatic dysfunction of lumbar region: Secondary | ICD-10-CM | POA: Diagnosis not present

## 2021-09-09 DIAGNOSIS — H2000 Unspecified acute and subacute iridocyclitis: Secondary | ICD-10-CM | POA: Diagnosis not present

## 2021-09-10 DIAGNOSIS — M9904 Segmental and somatic dysfunction of sacral region: Secondary | ICD-10-CM | POA: Diagnosis not present

## 2021-09-10 DIAGNOSIS — M9903 Segmental and somatic dysfunction of lumbar region: Secondary | ICD-10-CM | POA: Diagnosis not present

## 2021-09-10 DIAGNOSIS — M5442 Lumbago with sciatica, left side: Secondary | ICD-10-CM | POA: Diagnosis not present

## 2021-09-10 DIAGNOSIS — M461 Sacroiliitis, not elsewhere classified: Secondary | ICD-10-CM | POA: Diagnosis not present

## 2021-09-14 DIAGNOSIS — M9904 Segmental and somatic dysfunction of sacral region: Secondary | ICD-10-CM | POA: Diagnosis not present

## 2021-09-14 DIAGNOSIS — M5442 Lumbago with sciatica, left side: Secondary | ICD-10-CM | POA: Diagnosis not present

## 2021-09-14 DIAGNOSIS — M9903 Segmental and somatic dysfunction of lumbar region: Secondary | ICD-10-CM | POA: Diagnosis not present

## 2021-09-14 DIAGNOSIS — M461 Sacroiliitis, not elsewhere classified: Secondary | ICD-10-CM | POA: Diagnosis not present

## 2021-09-17 ENCOUNTER — Other Ambulatory Visit: Payer: Self-pay

## 2021-09-17 ENCOUNTER — Ambulatory Visit: Payer: PPO | Admitting: Internal Medicine

## 2021-09-17 ENCOUNTER — Encounter: Payer: Self-pay | Admitting: Internal Medicine

## 2021-09-17 VITALS — BP 148/68 | HR 57 | Ht 64.0 in | Wt 205.0 lb

## 2021-09-17 DIAGNOSIS — I491 Atrial premature depolarization: Secondary | ICD-10-CM | POA: Diagnosis not present

## 2021-09-17 DIAGNOSIS — M461 Sacroiliitis, not elsewhere classified: Secondary | ICD-10-CM | POA: Diagnosis not present

## 2021-09-17 DIAGNOSIS — M9903 Segmental and somatic dysfunction of lumbar region: Secondary | ICD-10-CM | POA: Diagnosis not present

## 2021-09-17 DIAGNOSIS — I493 Ventricular premature depolarization: Secondary | ICD-10-CM | POA: Diagnosis not present

## 2021-09-17 DIAGNOSIS — M9904 Segmental and somatic dysfunction of sacral region: Secondary | ICD-10-CM | POA: Diagnosis not present

## 2021-09-17 DIAGNOSIS — Z79899 Other long term (current) drug therapy: Secondary | ICD-10-CM | POA: Diagnosis not present

## 2021-09-17 DIAGNOSIS — M5442 Lumbago with sciatica, left side: Secondary | ICD-10-CM | POA: Diagnosis not present

## 2021-09-17 NOTE — Patient Instructions (Signed)
Medication Instructions:  - Your physician recommends that you continue on your current medications as directed. Please refer to the Current Medication list given to you today.  *If you need a refill on your cardiac medications before your next appointment, please call your pharmacy*   Lab Work: - none ordered  If you have labs (blood work) drawn today and your tests are completely normal, you will receive your results only by: [place] (if you have MyChart) OR A paper copy in the mail If you have any lab test that is abnormal or we need to change your treatment, we will call you to review the results.   Testing/Procedures: - none ordered   Follow-Up: At [facility], you and your health needs are our priority.  As part of our continuing mission to provide you with exceptional heart care, we have created designated Provider Care Teams.  These Care Teams include your primary Cardiologist (physician) and Advanced Practice Providers (APPs -  Physician Assistants and Nurse Practitioners) who all work together to provide you with the care you need, when you need it.  We recommend signing up for the patient portal called "MyChart".  Sign up information is provided on this After Visit Summary.  MyChart is used to connect with patients for Virtual Visits (Telemedicine).  Patients are able to view lab/test results, encounter notes, upcoming appointments, etc.  Non-urgent messages can be sent to your provider as well.   To learn more about what you can do with MyChart, go to [email or web address].    Your next appointment:   6 month(s)  The format for your next appointment:   In Person  Provider:   Virl Axe, MD   Other Instructions N/a

## 2021-09-17 NOTE — Progress Notes (Signed)
Patient Care Team: Adin Hector, MD as PCP - General (Internal Medicine)   HPI  Christina Underwood is a 75 y.o. female In follow-up for symptomatic PACs and PVCs treated with amiodarone--low-dose therapy for some years, taking 100 mg 3 days a week. Surveillance laboratories have been followed by Dr. Roney Marion.   The patient denies chest pain, nocturnal dyspnea, orthopnea or peripheral edema.  There have been lightheadedness or syncope.  Complains of scant palpitations, dyspnea with exertion but mostly her limitation is orthopedic with back pain.  Sleeps propped up but for back and not for breath.  Patient denies symptoms of GI intolerance, sun sensitivity, neurological symptoms attributable to amiodarone.      DATE TEST EF   2008 Cath  No CAD  1/16 Myoview  75 % No ischemia  10/19 Myoview 75% No Ischemia I    Date Cr K TSH LFT (ALT )  2/19   2.92 15  8/19    2.72 17  2/21 1.0 4.2 1.597 20  3/22 1.1 4.1 3.04 31  9/22 1.1 4.1  28<<40          Past Medical History:  Diagnosis Date   Abdominal discomfort    R upper quad   Actinic keratosis    Atypical chest pain    Basal cell carcinoma 10/24/2019   Left medial cheek/lateral to nose 3cm inferior to left medial canthus. Nodular pattern. Excised 12/04/2019, margins free.   Breast lump    CAD (coronary artery disease)    25% RCA 2008    Depression    GERD (gastroesophageal reflux disease)    HLD (hyperlipidemia)    HTN (hypertension)    Hypokalemia    Long QT interval    assoc with amiodarone   PAC (premature atrial contraction)    Right ventricular outflow tract premature ventricular contractions (PVCs)    Sleep apnea     Past Surgical History:  Procedure Laterality Date   APPENDECTOMY     bunion repairs     bilateral    CARDIAC CATHETERIZATION     [facility].   CATARACT EXTRACTION Right    COLONOSCOPY     lumbar laminextomy  2001   TOTAL ABDOMINAL HYSTERECTOMY W/  BILATERAL SALPINGOOPHORECTOMY     VESICOVAGINAL FISTULA CLOSURE W/ TAH      Current Outpatient Medications  Medication Sig Dispense Refill   amiodarone (PACERONE) 200 MG tablet Take half tablet (100mg ) by mouth daily.     aspirin 81 MG EC tablet Take 81 mg by mouth daily.     atorvastatin (LIPITOR) 40 MG tablet Take 40 mg by mouth at bedtime.     calcium carbonate (OS-CAL) 600 MG TABS tablet Take 600 mg by mouth daily with breakfast.     cetirizine (ZYRTEC) 10 MG tablet Take 10 mg by mouth daily.     escitalopram (LEXAPRO) 10 MG tablet Take 10 mg by mouth at bedtime.      gabapentin (NEURONTIN) 300 MG capsule TAKE 3 CAPSULES BY MOUTH 90 MINUTES PRIOR TO BEDTIME     pantoprazole (PROTONIX) 40 MG tablet TAKE 1 TABLET(40 MG) BY MOUTH EVERY DAY     valsartan-hydrochlorothiazide (DIOVAN-HCT) 320-25 MG per tablet Take 1 tablet by mouth daily.      No current facility-administered medications for this visit.    Allergies  Allergen Reactions   Sulfonamide Derivatives    Sulfa Antibiotics Itching and Rash  Review of Systems negative except from HPI and PMH  Physical Exam BP (!) 148/68 (BP Location: Left Arm, Patient Position: Sitting, Cuff Size: Normal)   Pulse (!) 57   Ht 5\' 4"  (1.626 m)   Wt 205 lb (93 kg)   SpO2 95%   BMI 35.19 kg/m  Well developed and nourished in no acute distress HENT normal Neck supple with JVP-  flat  Clear Regular rate and rhythm, no murmurs or gallops Abd-soft   No Clubbing cyanosis edema Skin-warm and dry A & Oriented  Grossly normal sensory and motor function  ECG sinus at 57 Interval 23/07/47   Assessment and  Plan  PACs/PVCs-symptomatic  Amiodarone therapy-high risk  Cardiomyopathy recovered  Sinus bradycardia/1 AVB  Hypertension  Non vaccinator  Toelrating amio  continue 100mg  daily surveillance laboratories had raised the concern about hypertransaminasemia, but subsequent testing demonstrated normalization  BP reasonably  controlled--130s at home  continue diovan HCT 320/25  Sinus brady remains evident , and some DOE but not very active with orthopedics limitations suggested she consider water aerobics if she pursues fitness she finds her self unable to make progress, would want to consider chronotropic incompetence

## 2021-09-21 DIAGNOSIS — M9903 Segmental and somatic dysfunction of lumbar region: Secondary | ICD-10-CM | POA: Diagnosis not present

## 2021-09-21 DIAGNOSIS — M9904 Segmental and somatic dysfunction of sacral region: Secondary | ICD-10-CM | POA: Diagnosis not present

## 2021-09-21 DIAGNOSIS — M5442 Lumbago with sciatica, left side: Secondary | ICD-10-CM | POA: Diagnosis not present

## 2021-09-21 DIAGNOSIS — M461 Sacroiliitis, not elsewhere classified: Secondary | ICD-10-CM | POA: Diagnosis not present

## 2021-09-24 DIAGNOSIS — M5442 Lumbago with sciatica, left side: Secondary | ICD-10-CM | POA: Diagnosis not present

## 2021-09-24 DIAGNOSIS — M9904 Segmental and somatic dysfunction of sacral region: Secondary | ICD-10-CM | POA: Diagnosis not present

## 2021-09-24 DIAGNOSIS — M9903 Segmental and somatic dysfunction of lumbar region: Secondary | ICD-10-CM | POA: Diagnosis not present

## 2021-09-24 DIAGNOSIS — M461 Sacroiliitis, not elsewhere classified: Secondary | ICD-10-CM | POA: Diagnosis not present

## 2021-09-29 ENCOUNTER — Other Ambulatory Visit: Payer: Self-pay | Admitting: Internal Medicine

## 2021-09-29 DIAGNOSIS — Z1231 Encounter for screening mammogram for malignant neoplasm of breast: Secondary | ICD-10-CM

## 2021-10-05 DIAGNOSIS — M461 Sacroiliitis, not elsewhere classified: Secondary | ICD-10-CM | POA: Diagnosis not present

## 2021-10-05 DIAGNOSIS — M5442 Lumbago with sciatica, left side: Secondary | ICD-10-CM | POA: Diagnosis not present

## 2021-10-05 DIAGNOSIS — M9904 Segmental and somatic dysfunction of sacral region: Secondary | ICD-10-CM | POA: Diagnosis not present

## 2021-10-05 DIAGNOSIS — M9903 Segmental and somatic dysfunction of lumbar region: Secondary | ICD-10-CM | POA: Diagnosis not present

## 2021-11-16 ENCOUNTER — Other Ambulatory Visit: Payer: Self-pay

## 2021-11-16 ENCOUNTER — Ambulatory Visit
Admission: RE | Admit: 2021-11-16 | Discharge: 2021-11-16 | Disposition: A | Discharge status: Home / Self Care | Payer: PPO | Source: Ambulatory Visit | Attending: Internal Medicine | Admitting: Internal Medicine

## 2021-11-16 DIAGNOSIS — Z1231 Encounter for screening mammogram for malignant neoplasm of breast: Secondary | ICD-10-CM | POA: Diagnosis not present

## 2021-12-31 DIAGNOSIS — M9904 Segmental and somatic dysfunction of sacral region: Secondary | ICD-10-CM | POA: Diagnosis not present

## 2021-12-31 DIAGNOSIS — M9903 Segmental and somatic dysfunction of lumbar region: Secondary | ICD-10-CM | POA: Diagnosis not present

## 2021-12-31 DIAGNOSIS — M9901 Segmental and somatic dysfunction of cervical region: Secondary | ICD-10-CM | POA: Diagnosis not present

## 2021-12-31 DIAGNOSIS — M461 Sacroiliitis, not elsewhere classified: Secondary | ICD-10-CM | POA: Diagnosis not present

## 2021-12-31 DIAGNOSIS — M5451 Vertebrogenic low back pain: Secondary | ICD-10-CM | POA: Diagnosis not present

## 2021-12-31 DIAGNOSIS — M542 Cervicalgia: Secondary | ICD-10-CM | POA: Diagnosis not present

## 2022-01-04 DIAGNOSIS — M9901 Segmental and somatic dysfunction of cervical region: Secondary | ICD-10-CM | POA: Diagnosis not present

## 2022-01-04 DIAGNOSIS — M461 Sacroiliitis, not elsewhere classified: Secondary | ICD-10-CM | POA: Diagnosis not present

## 2022-01-04 DIAGNOSIS — M9903 Segmental and somatic dysfunction of lumbar region: Secondary | ICD-10-CM | POA: Diagnosis not present

## 2022-01-04 DIAGNOSIS — M9904 Segmental and somatic dysfunction of sacral region: Secondary | ICD-10-CM | POA: Diagnosis not present

## 2022-01-04 DIAGNOSIS — M542 Cervicalgia: Secondary | ICD-10-CM | POA: Diagnosis not present

## 2022-01-04 DIAGNOSIS — M5451 Vertebrogenic low back pain: Secondary | ICD-10-CM | POA: Diagnosis not present

## 2022-01-05 DIAGNOSIS — M5451 Vertebrogenic low back pain: Secondary | ICD-10-CM | POA: Diagnosis not present

## 2022-01-05 DIAGNOSIS — M542 Cervicalgia: Secondary | ICD-10-CM | POA: Diagnosis not present

## 2022-01-05 DIAGNOSIS — M9904 Segmental and somatic dysfunction of sacral region: Secondary | ICD-10-CM | POA: Diagnosis not present

## 2022-01-05 DIAGNOSIS — M461 Sacroiliitis, not elsewhere classified: Secondary | ICD-10-CM | POA: Diagnosis not present

## 2022-01-05 DIAGNOSIS — M9903 Segmental and somatic dysfunction of lumbar region: Secondary | ICD-10-CM | POA: Diagnosis not present

## 2022-01-05 DIAGNOSIS — M9901 Segmental and somatic dysfunction of cervical region: Secondary | ICD-10-CM | POA: Diagnosis not present

## 2022-01-07 DIAGNOSIS — M9904 Segmental and somatic dysfunction of sacral region: Secondary | ICD-10-CM | POA: Diagnosis not present

## 2022-01-07 DIAGNOSIS — M542 Cervicalgia: Secondary | ICD-10-CM | POA: Diagnosis not present

## 2022-01-07 DIAGNOSIS — M9903 Segmental and somatic dysfunction of lumbar region: Secondary | ICD-10-CM | POA: Diagnosis not present

## 2022-01-07 DIAGNOSIS — M5451 Vertebrogenic low back pain: Secondary | ICD-10-CM | POA: Diagnosis not present

## 2022-01-07 DIAGNOSIS — M9901 Segmental and somatic dysfunction of cervical region: Secondary | ICD-10-CM | POA: Diagnosis not present

## 2022-01-07 DIAGNOSIS — M461 Sacroiliitis, not elsewhere classified: Secondary | ICD-10-CM | POA: Diagnosis not present

## 2022-01-19 DIAGNOSIS — M542 Cervicalgia: Secondary | ICD-10-CM | POA: Diagnosis not present

## 2022-01-19 DIAGNOSIS — M5451 Vertebrogenic low back pain: Secondary | ICD-10-CM | POA: Diagnosis not present

## 2022-01-19 DIAGNOSIS — M9901 Segmental and somatic dysfunction of cervical region: Secondary | ICD-10-CM | POA: Diagnosis not present

## 2022-01-19 DIAGNOSIS — M9903 Segmental and somatic dysfunction of lumbar region: Secondary | ICD-10-CM | POA: Diagnosis not present

## 2022-01-19 DIAGNOSIS — M461 Sacroiliitis, not elsewhere classified: Secondary | ICD-10-CM | POA: Diagnosis not present

## 2022-01-19 DIAGNOSIS — M9904 Segmental and somatic dysfunction of sacral region: Secondary | ICD-10-CM | POA: Diagnosis not present

## 2022-01-21 DIAGNOSIS — M9903 Segmental and somatic dysfunction of lumbar region: Secondary | ICD-10-CM | POA: Diagnosis not present

## 2022-01-21 DIAGNOSIS — M9904 Segmental and somatic dysfunction of sacral region: Secondary | ICD-10-CM | POA: Diagnosis not present

## 2022-01-21 DIAGNOSIS — M461 Sacroiliitis, not elsewhere classified: Secondary | ICD-10-CM | POA: Diagnosis not present

## 2022-01-21 DIAGNOSIS — M9901 Segmental and somatic dysfunction of cervical region: Secondary | ICD-10-CM | POA: Diagnosis not present

## 2022-01-21 DIAGNOSIS — M542 Cervicalgia: Secondary | ICD-10-CM | POA: Diagnosis not present

## 2022-01-21 DIAGNOSIS — M5451 Vertebrogenic low back pain: Secondary | ICD-10-CM | POA: Diagnosis not present

## 2022-01-22 DIAGNOSIS — H2512 Age-related nuclear cataract, left eye: Secondary | ICD-10-CM | POA: Diagnosis not present

## 2022-01-25 DIAGNOSIS — M9904 Segmental and somatic dysfunction of sacral region: Secondary | ICD-10-CM | POA: Diagnosis not present

## 2022-01-25 DIAGNOSIS — M542 Cervicalgia: Secondary | ICD-10-CM | POA: Diagnosis not present

## 2022-01-25 DIAGNOSIS — M5451 Vertebrogenic low back pain: Secondary | ICD-10-CM | POA: Diagnosis not present

## 2022-01-25 DIAGNOSIS — M9901 Segmental and somatic dysfunction of cervical region: Secondary | ICD-10-CM | POA: Diagnosis not present

## 2022-01-25 DIAGNOSIS — M461 Sacroiliitis, not elsewhere classified: Secondary | ICD-10-CM | POA: Diagnosis not present

## 2022-01-25 DIAGNOSIS — M9903 Segmental and somatic dysfunction of lumbar region: Secondary | ICD-10-CM | POA: Diagnosis not present

## 2022-01-28 DIAGNOSIS — M9904 Segmental and somatic dysfunction of sacral region: Secondary | ICD-10-CM | POA: Diagnosis not present

## 2022-01-28 DIAGNOSIS — M5451 Vertebrogenic low back pain: Secondary | ICD-10-CM | POA: Diagnosis not present

## 2022-01-28 DIAGNOSIS — M461 Sacroiliitis, not elsewhere classified: Secondary | ICD-10-CM | POA: Diagnosis not present

## 2022-01-28 DIAGNOSIS — M9903 Segmental and somatic dysfunction of lumbar region: Secondary | ICD-10-CM | POA: Diagnosis not present

## 2022-01-28 DIAGNOSIS — M542 Cervicalgia: Secondary | ICD-10-CM | POA: Diagnosis not present

## 2022-01-28 DIAGNOSIS — M9901 Segmental and somatic dysfunction of cervical region: Secondary | ICD-10-CM | POA: Diagnosis not present

## 2022-02-02 DIAGNOSIS — M9904 Segmental and somatic dysfunction of sacral region: Secondary | ICD-10-CM | POA: Diagnosis not present

## 2022-02-02 DIAGNOSIS — M542 Cervicalgia: Secondary | ICD-10-CM | POA: Diagnosis not present

## 2022-02-02 DIAGNOSIS — M5451 Vertebrogenic low back pain: Secondary | ICD-10-CM | POA: Diagnosis not present

## 2022-02-02 DIAGNOSIS — M9903 Segmental and somatic dysfunction of lumbar region: Secondary | ICD-10-CM | POA: Diagnosis not present

## 2022-02-02 DIAGNOSIS — M461 Sacroiliitis, not elsewhere classified: Secondary | ICD-10-CM | POA: Diagnosis not present

## 2022-02-02 DIAGNOSIS — M9901 Segmental and somatic dysfunction of cervical region: Secondary | ICD-10-CM | POA: Diagnosis not present

## 2022-02-04 DIAGNOSIS — M461 Sacroiliitis, not elsewhere classified: Secondary | ICD-10-CM | POA: Diagnosis not present

## 2022-02-04 DIAGNOSIS — M9904 Segmental and somatic dysfunction of sacral region: Secondary | ICD-10-CM | POA: Diagnosis not present

## 2022-02-04 DIAGNOSIS — M5451 Vertebrogenic low back pain: Secondary | ICD-10-CM | POA: Diagnosis not present

## 2022-02-04 DIAGNOSIS — M9901 Segmental and somatic dysfunction of cervical region: Secondary | ICD-10-CM | POA: Diagnosis not present

## 2022-02-04 DIAGNOSIS — M9903 Segmental and somatic dysfunction of lumbar region: Secondary | ICD-10-CM | POA: Diagnosis not present

## 2022-02-04 DIAGNOSIS — M542 Cervicalgia: Secondary | ICD-10-CM | POA: Diagnosis not present

## 2022-02-08 DIAGNOSIS — M9904 Segmental and somatic dysfunction of sacral region: Secondary | ICD-10-CM | POA: Diagnosis not present

## 2022-02-08 DIAGNOSIS — M9903 Segmental and somatic dysfunction of lumbar region: Secondary | ICD-10-CM | POA: Diagnosis not present

## 2022-02-08 DIAGNOSIS — M542 Cervicalgia: Secondary | ICD-10-CM | POA: Diagnosis not present

## 2022-02-08 DIAGNOSIS — M9901 Segmental and somatic dysfunction of cervical region: Secondary | ICD-10-CM | POA: Diagnosis not present

## 2022-02-08 DIAGNOSIS — M461 Sacroiliitis, not elsewhere classified: Secondary | ICD-10-CM | POA: Diagnosis not present

## 2022-02-08 DIAGNOSIS — M5451 Vertebrogenic low back pain: Secondary | ICD-10-CM | POA: Diagnosis not present

## 2022-02-15 ENCOUNTER — Encounter: Payer: PPO | Admitting: Dermatology

## 2022-02-22 DIAGNOSIS — M9901 Segmental and somatic dysfunction of cervical region: Secondary | ICD-10-CM | POA: Diagnosis not present

## 2022-02-22 DIAGNOSIS — M9904 Segmental and somatic dysfunction of sacral region: Secondary | ICD-10-CM | POA: Diagnosis not present

## 2022-02-22 DIAGNOSIS — M9903 Segmental and somatic dysfunction of lumbar region: Secondary | ICD-10-CM | POA: Diagnosis not present

## 2022-02-22 DIAGNOSIS — M461 Sacroiliitis, not elsewhere classified: Secondary | ICD-10-CM | POA: Diagnosis not present

## 2022-02-22 DIAGNOSIS — M542 Cervicalgia: Secondary | ICD-10-CM | POA: Diagnosis not present

## 2022-02-22 DIAGNOSIS — M5451 Vertebrogenic low back pain: Secondary | ICD-10-CM | POA: Diagnosis not present

## 2022-03-03 ENCOUNTER — Other Ambulatory Visit: Payer: Self-pay

## 2022-03-03 ENCOUNTER — Ambulatory Visit: Payer: PPO | Admitting: Dermatology

## 2022-03-03 DIAGNOSIS — D1800 Hemangioma unspecified site: Secondary | ICD-10-CM | POA: Diagnosis not present

## 2022-03-03 DIAGNOSIS — Z85828 Personal history of other malignant neoplasm of skin: Secondary | ICD-10-CM | POA: Diagnosis not present

## 2022-03-03 DIAGNOSIS — L918 Other hypertrophic disorders of the skin: Secondary | ICD-10-CM | POA: Diagnosis not present

## 2022-03-03 DIAGNOSIS — L578 Other skin changes due to chronic exposure to nonionizing radiation: Secondary | ICD-10-CM

## 2022-03-03 DIAGNOSIS — L814 Other melanin hyperpigmentation: Secondary | ICD-10-CM

## 2022-03-03 DIAGNOSIS — L821 Other seborrheic keratosis: Secondary | ICD-10-CM | POA: Diagnosis not present

## 2022-03-03 DIAGNOSIS — Z1283 Encounter for screening for malignant neoplasm of skin: Secondary | ICD-10-CM

## 2022-03-03 DIAGNOSIS — B353 Tinea pedis: Secondary | ICD-10-CM

## 2022-03-03 DIAGNOSIS — L820 Inflamed seborrheic keratosis: Secondary | ICD-10-CM

## 2022-03-03 DIAGNOSIS — D229 Melanocytic nevi, unspecified: Secondary | ICD-10-CM | POA: Diagnosis not present

## 2022-03-03 MED ORDER — KETOCONAZOLE 2 % EX CREA
1.0000 | TOPICAL_CREAM | Freq: Every evening | CUTANEOUS | 11 refills | Status: AC
Start: 2022-03-03 — End: 2022-04-14

## 2022-03-03 NOTE — Progress Notes (Signed)
? ?Follow-Up Visit ?  ?Subjective  ?Christina Underwood is a 76 y.o. female who presents for the following: Annual Exam (Patient here for full body skin exam and skin cancer screening. Patient with hx of BCC. She does have a dry spot at right upper thigh, present for several months. No itching or bleeding but it is rough. ). ?The patient presents for Total-Body Skin Exam (TBSE) for skin cancer screening and mole check.  The patient has spots, moles and lesions to be evaluated, some may be new or changing and the patient has concerns that these could be cancer. ? ?The following portions of the chart were reviewed this encounter and updated as appropriate:  ? Tobacco  Allergies  Meds  Problems  Med Hx  Surg Hx  Fam Hx   ?  ?Review of Systems:  No other skin or systemic complaints except as noted in HPI or Assessment and Plan. ? ?Objective  ?Well appearing patient in no apparent distress; mood and affect are within normal limits. ? ?A full examination was performed including scalp, head, eyes, ears, nose, lips, neck, chest, axillae, abdomen, back, buttocks, bilateral upper extremities, bilateral lower extremities, hands, feet, fingers, toes, fingernails, and toenails. All findings within normal limits unless otherwise noted below. ? ?right thigh x 1, left postauricular x 1 (2) ?Erythematous stuck-on, waxy papule or plaque ? ?bilateral feet ?Scaling and maceration web spaces and over distal and lateral soles.  ? ? ?Assessment & Plan  ?Inflamed seborrheic keratosis (2) ?right thigh x 1, left postauricular x 1 ? ?Destruction of lesion - right thigh x 1, left postauricular x 1 ?Complexity: simple   ?Destruction method: cryotherapy   ?Informed consent: discussed and consent obtained   ?Timeout:  patient name, date of birth, surgical site, and procedure verified ?Lesion destroyed using liquid nitrogen: Yes   ?Region frozen until ice ball extended beyond lesion: Yes   ?Outcome: patient tolerated procedure well with no  complications   ?Post-procedure details: wound care instructions given   ? ?Tinea pedis of both feet ?bilateral feet ?Chronic and persistent condition with duration or expected duration over one year. Condition is symptomatic / bothersome to patient. Not to goal. ?.Start ketoconazole 2% cream to feet nightly.  ? ?ketoconazole (NIZORAL) 2 % cream - bilateral feet ?Apply 1 application. topically at bedtime. To feet ? ?Lentigines ?- Scattered tan macules ?- Due to sun exposure ?- Benign-appearing, observe ?- Recommend daily broad spectrum sunscreen SPF 30+ to sun-exposed areas, reapply every 2 hours as needed. ?- Call for any changes ? ?Seborrheic Keratoses ?- Stuck-on, waxy, tan-brown papules and/or plaques  ?- Benign-appearing ?- Discussed benign etiology and prognosis. ?- Observe ?- Call for any changes ? ?Melanocytic Nevi ?- Tan-brown and/or pink-flesh-colored symmetric macules and papules ?- Benign appearing on exam today ?- Observation ?- Call clinic for new or changing moles ?- Recommend daily use of broad spectrum spf 30+ sunscreen to sun-exposed areas.  ? ?Hemangiomas ?- Red papules ?- Discussed benign nature ?- Observe ?- Call for any changes ? ?Actinic Damage ?- Chronic condition, secondary to cumulative UV/sun exposure ?- diffuse scaly erythematous macules with underlying dyspigmentation ?- Recommend daily broad spectrum sunscreen SPF 30+ to sun-exposed areas, reapply every 2 hours as needed.  ?- Staying in the shade or wearing long sleeves, sun glasses (UVA+UVB protection) and wide brim hats (4-inch brim around the entire circumference of the hat) are also recommended for sun protection.  ?- Call for new or changing lesions. ? ?Skin cancer  screening performed today. ? ?History of Basal Cell Carcinoma of the Skin ?- No evidence of recurrence today ?- Recommend regular full body skin exams ?- Recommend daily broad spectrum sunscreen SPF 30+ to sun-exposed areas, reapply every 2 hours as needed.  ?- Call if  any new or changing lesions are noted between office visits ? ?Acrochordons (Skin Tags) ?- Fleshy, skin-colored pedunculated papules ?- Benign appearing.  ?- Observe. ?- If desired, they can be removed with an in office procedure that is not covered by insurance. ?- Please call the clinic if you notice any new or changing lesions. ? ?Return in about 1 year (around 03/04/2023) for TBSE. ? ?Graciella Belton, RMA, am acting as scribe for Sarina Ser, MD . ?Documentation: I have reviewed the above documentation for accuracy and completeness, and I agree with the above. ? ?Sarina Ser, MD ? ?

## 2022-03-03 NOTE — Patient Instructions (Addendum)
Cryotherapy Aftercare ? ?Wash gently with soap and water everyday.   ?Apply Vaseline and Band-Aid daily until healed.  ? ? ?Melanoma ABCDEs ? ?Melanoma is the most dangerous type of skin cancer, and is the leading cause of death from skin disease.  You are more likely to develop melanoma if you: ?Have light-colored skin, light-colored eyes, or red or blond hair ?Spend a lot of time in the sun ?Tan regularly, either outdoors or in a tanning bed ?Have had blistering sunburns, especially during childhood ?Have a close family member who has had a melanoma ?Have atypical moles or large birthmarks ? ?Early detection of melanoma is key since treatment is typically straightforward and cure rates are extremely high if we catch it early.  ? ?The first sign of melanoma is often a change in a mole or a new dark spot.  The ABCDE system is a way of remembering the signs of melanoma. ? ?A for asymmetry:  The two halves do not match. ?B for border:  The edges of the growth are irregular. ?C for color:  A mixture of colors are present instead of an even brown color. ?D for diameter:  Melanomas are usually (but not always) greater than 22m - the size of a pencil eraser. ?E for evolution:  The spot keeps changing in size, shape, and color. ? ?Please check your skin once per month between visits. You can use a small mirror in front and a large mirror behind you to keep an eye on the back side or your body.  ? ?If you see any new or changing lesions before your next follow-up, please call to schedule a visit. ? ?Please continue daily skin protection including broad spectrum sunscreen SPF 30+ to sun-exposed areas, reapplying every 2 hours as needed when you're outdoors.   ? ?If You Need Anything After Your Visit ? ?If you have any questions or concerns for your doctor, please call our main line at [phone] press option 4 to reach your doctor's medical assistant. If no one answers, please leave a voicemail as directed and we will  return your call as soon as possible. Messages left after 4 pm will be answered the following business day.  ? ?You may also send [place] message via MyChart. We typically respond to MyChart messages within 1-2 business days. ? ?For prescription refills, please ask your pharmacy to contact our office. Our fax number is [phone] ? ?If you have an urgent issue when the clinic is closed that cannot wait until the next business day, you can page your doctor at the number below.   ? ?Please note that while we do our best to be available for urgent issues outside of office hours, we are not available 24/7.  ? ?If you have an urgent issue and are unable to reach [place] you may choose to seek medical care at your doctor's office, retail clinic, urgent care center, or emergency room. ? ?If you have a medical emergency, please immediately call 911 or go to the emergency department. ? ?Pager Numbers ? ?- Dr. KNehemiah Massed [phone]? ?- Dr. MLaurence Ferrari [phone]? ?- Dr. SNicole Kindred [phone]? ?In the event of inclement weather, please call our main line at [phone] an update on the status of any delays or closures. ? ?Dermatology Medication Tips: ?Please keep the boxes that topical medications come in in order to help keep track of the instructions about where and how to use these. Pharmacies typically print the medication  instructions only on the boxes and not directly on the medication tubes.  ? ?If your medication is too expensive, please contact our office at [phone] option 4 or send [place] a message through [place].  ? ?We are unable to tell what your co-pay for medications will be in advance as this is different depending on your insurance coverage. However, we may be able to find a substitute medication at lower cost or fill out paperwork to get insurance to cover a needed medication.  ? ?If a prior authorization is required to get your medication covered by your insurance company, please allow [place] 1-2 business  days to complete this process. ? ?Drug prices often vary depending on where the prescription is filled and some pharmacies may offer cheaper prices. ? ?The website www.goodrx.com contains coupons for medications through different pharmacies. The prices here do not account for what the cost may be with help from insurance (it may be cheaper with your insurance), but the website can give you the price if you did not use any insurance.  ?- You can print the associated coupon and take it with your prescription to the pharmacy.  ?- You may also stop by our office during regular business hours and pick up a GoodRx coupon card.  ?- If you need your prescription sent electronically to a different pharmacy, notify our office through [facility] or by phone at [phone] option 4. ? ? ? ? ?Si Usted Necesita Algo Despu?s de Su Visita ? ?Tambi?n puede enviarnos un mensaje a trav?s de MyChart. Por lo general respondemos a los mensajes de MyChart en el transcurso de 1 a 2 d?as h?biles. ? ?Para renovar recetas, por favor pida a su farmacia que se ponga en contacto con nuestra oficina. Nuestro n?mero de fax es el [phone]. ? ?Si tiene un asunto urgente cuando la cl?nica est? cerrada y que no puede esperar hasta el siguiente d?a h?bil, puede llamar/localizar a su doctor(a) al n?mero que aparece a continuaci?n.  ? ?Por favor, tenga en cuenta que aunque hacemos todo lo posible para estar disponibles para asuntos urgentes fuera del horario de oficina, no estamos disponibles las 24 horas del d?a, los 7 d?as de la semana.  ? ?Si tiene un problema urgente y no puede comunicarse con nosotros, puede optar por buscar atenci?n m?dica  en el consultorio de su doctor(a), en una cl?nica privada, en un centro de atenci?n urgente o en una sala de emergencias. ? ?Si tiene Engineer, maintenance (IT) m?dica, por favor llame inmediatamente al 911 o vaya a la sala de emergencias. ? ?N?meros de b?per ? ?- Dr. Nehemiah Massed: [phone] ? ?- Dra. Moye:  [phone] ? ?- Dra. Nicole Kindred: [phone] ? ?En caso de inclemencias del tiempo, por favor llame a nuestra l?nea principal al [phone] para una actualizaci?n sobre el estado de cualquier retraso o cierre. ? ?Consejos para la medicaci?n en dermatolog?a: ?Por favor, guarde las cajas en las que vienen los medicamentos de uso t?pico para ayudarle a seguir las instrucciones sobre d?nde y c?mo usarlos. Las farmacias generalmente imprimen las instrucciones del medicamento s?lo en las cajas y no directamente en los tubos del [place].  ? ?Si su medicamento es muy caro, por favor, p?ngase en contacto con Zigmund Daniel llamando al [phone] y presione la opci?n 4 o env?enos un mensaje a trav?s de MyChart.  ? ?No podemos decirle cu?l ser? su copago por los medicamentos por adelantado ya que esto es diferente dependiendo de la cobertura de su seguro. Sin embargo,  es posible que podamos encontrar un medicamento sustituto a Electrical engineer un formulario para que el seguro cubra el medicamento que se considera necesario.  ? ?Si se requiere Ardelia Mems autorizaci?n previa para que su compa??a de seguros [place] su medicamento, por favor perm?tanos de 1 a 2 d?as h?biles para completar este proceso. ? ?Los precios de los medicamentos var?an con frecuencia dependiendo del Environmental consultant de d?nde se surte la receta y alguna farmacias pueden ofrecer precios m?s baratos. ? ?El sitio web www.goodrx.com tiene cupones para medicamentos de Airline pilot. Los precios aqu? no tienen en cuenta lo que podr?a costar con la ayuda del seguro (puede ser m?s barato con su seguro), pero el sitio web puede darle el precio si no utiliz? ning?n seguro.  ?- Puede imprimir el cup?n correspondiente y llevarlo con su receta a la farmacia.  ?- Tambi?n puede pasar por nuestra oficina durante el horario de atenci?n regular y recoger una tarjeta de cupones de GoodRx.  ?- Si necesita que su receta se env?e electr?nicamente a Chiropodist,  informe a nuestra oficina a trav?s de MyChart de [place] o por tel?fono llamando al [phone] y presione la opci?n 4. ? ?

## 2022-03-04 ENCOUNTER — Ambulatory Visit: Payer: PPO | Admitting: Internal Medicine

## 2022-03-04 ENCOUNTER — Encounter: Payer: Self-pay | Admitting: Internal Medicine

## 2022-03-04 VITALS — BP 140/60 | HR 57 | Ht 64.0 in | Wt 202.0 lb

## 2022-03-04 DIAGNOSIS — I493 Ventricular premature depolarization: Secondary | ICD-10-CM

## 2022-03-04 DIAGNOSIS — Z79899 Other long term (current) drug therapy: Secondary | ICD-10-CM

## 2022-03-04 DIAGNOSIS — I491 Atrial premature depolarization: Secondary | ICD-10-CM

## 2022-03-04 NOTE — Patient Instructions (Signed)
Medication Instructions:  ?Your physician recommends that you continue on your current medications as directed. Please refer to the Current Medication list given to you today. ? ?*If you need a refill on your cardiac medications before your next appointment, please call your pharmacy* ? ? ?Lab Work: ?TSH and Liver Panel to be drawn by PCP next week per pt. ? ?If you have labs (blood work) drawn today and your tests are completely normal, you will receive your results only by: ?MyChart Message (if you have MyChart) OR ?A paper copy in the mail ?If you have any lab test that is abnormal or we need to change your treatment, we will call you to review the results. ? ? ?Testing/Procedures: ?None ordered. ? ? ? ?Follow-Up: ?At [facility], you and your health needs are our priority.  As part of our continuing mission to provide you with exceptional heart care, we have created designated Provider Care Teams.  These Care Teams include your primary Cardiologist (physician) and Advanced Practice Providers (APPs -  Physician Assistants and Nurse Practitioners) who all work together to provide you with the care you need, when you need it. ? ?We recommend signing up for the patient portal called "MyChart".  Sign up information is provided on this After Visit Summary.  MyChart is used to connect with patients for Virtual Visits (Telemedicine).  Patients are able to view lab/test results, encounter notes, upcoming appointments, etc.  Non-urgent messages can be sent to your provider as well.   ?To learn more about what you can do with MyChart, go to [email or web address].   ? ?Your next appointment:   ?6 months with Dr Caryl Comes ?

## 2022-03-04 NOTE — Progress Notes (Signed)
Patient Care Team: ?Adin Hector, MD as PCP - General (Internal Medicine) ? ? ?HPI ? ?Christina Underwood is a 76 y.o. female ?In follow-up for symptomatic PACs and PVCs treated with amiodarone--low-dose therapy for some years, taking 100 mg 3 days a week. Surveillance laboratories have been followed by Dr. Roney Marion. ? ?The patient denies chest pain, nocturnal dyspnea, orthopnea or peripheral edema.  There have been no   lightheadedness or syncope.  Complains of palpitations which are infrequent.  Dyspnea on exertion but mostly she is limited by back pain.  No real change in functional status. ? ?Patient denies symptoms of respiratory, GI intolerance, sun sensitivity, neurological symptoms attributable to amiodarone.     ? ?DATE TEST EF   ?2008 Cath  No CAD  ?1/16 Myoview  75 % No ischemia  ?10/19 Myoview 75% No Ischemia   ? ? ?Date Cr K TSH LFT (ALT ) LDL  ?2/19   2.92 15   ?8/19    2.72 17   ?2/21 1.0 4.2 1.597 20   ?3/22 1.1 4.1 3.04 31   ?9/22 1.1 4.1  28<<40 74  ? ?  ?  ?  ? ?Past Medical History:  ?Diagnosis Date  ? Abdominal discomfort   ? R upper quad  ? Actinic keratosis   ? Atypical chest pain   ? Basal cell carcinoma 10/24/2019  ? Left medial cheek/lateral to nose 3cm inferior to left medial canthus. Nodular pattern. Excised 12/04/2019, margins free.  ? Breast lump   ? CAD (coronary artery disease)   ? 25% RCA 2008   ? Depression   ? GERD (gastroesophageal reflux disease)   ? HLD (hyperlipidemia)   ? HTN (hypertension)   ? Hypokalemia   ? Long QT interval   ? assoc with amiodarone  ? PAC (premature atrial contraction)   ? Right ventricular outflow tract premature ventricular contractions (PVCs)   ? Sleep apnea   ? ? ?Past Surgical History:  ?Procedure Laterality Date  ? APPENDECTOMY    ? bunion repairs    ? bilateral   ? CARDIAC CATHETERIZATION    ? [facility]  ? CARDIAC CATHETERIZATION    ? [facility]  ? CATARACT EXTRACTION Right   ? COLONOSCOPY    ? lumbar laminextomy  2001  ? TOTAL ABDOMINAL  HYSTERECTOMY W/ BILATERAL SALPINGOOPHORECTOMY    ? VESICOVAGINAL FISTULA CLOSURE W/ TAH    ? ? ?Current Outpatient Medications  ?Medication Sig Dispense Refill  ? amiodarone (PACERONE) 200 MG tablet Take half tablet ('100mg'$ ) by mouth daily.    ? aspirin 81 MG EC tablet Take 81 mg by mouth daily.    ? atorvastatin (LIPITOR) 40 MG tablet Take 40 mg by mouth at bedtime.    ? calcium carbonate (OS-CAL) 600 MG TABS tablet Take 600 mg by mouth daily with breakfast.    ? cetirizine (ZYRTEC) 10 MG tablet Take 10 mg by mouth daily.    ? escitalopram (LEXAPRO) 10 MG tablet Take 10 mg by mouth at bedtime.     ? gabapentin (NEURONTIN) 300 MG capsule TAKE 3 CAPSULES BY MOUTH 90 MINUTES PRIOR TO BEDTIME    ? ketoconazole (NIZORAL) 2 % cream Apply 1 application. topically at bedtime. To feet 60 g 11  ? pantoprazole (PROTONIX) 40 MG tablet TAKE 1 TABLET(40 MG) BY MOUTH EVERY DAY    ? valsartan-hydrochlorothiazide (DIOVAN-HCT) 320-25 MG per tablet Take 1 tablet by mouth daily.     ? ?No current facility-administered  medications for this visit.  ? ? ?Allergies  ?Allergen Reactions  ? Sulfonamide Derivatives   ? Sulfa Antibiotics Itching and Rash  ? ? ?Review of Systems negative except from HPI and PMH ? ?Physical Exam ?BP 140/60 (BP Location: Left Arm, Patient Position: Sitting, Cuff Size: Normal)   Pulse (!) 57   Ht '5\' 4"'$  (1.626 m)   Wt 202 lb (91.6 kg)   SpO2 92%   BMI 34.67 kg/m?  ?Well developed and nourished in no acute distress ?HENT normal ?Neck supple  ?Clear ?Regular rate and rhythm, no murmurs or gallops ?Abd-soft   ?No Clubbing cyanosis edema ?Skin-warm and dry ?A & Oriented  Grossly normal sensory and motor function ? ?ECG sinus at 57 ?Interval 21/07/35 ? ? ?Assessment and  Plan ? ?PACs/PVCs-symptomatic ? ?Amiodarone therapy-high risk medication  ? ?Cardiomyopathy recovered ? ?Sinus bradycardia/1 AVB ? ?Hypertension ? ?Non vaccinator ? ?Tolerating amiodarone for PVCs and PACs.  Scant palpitations.  Continue 100 mg  daily.  Surveillance labs to be drawn by Dr. Caryn Section next week. ? ?Blood pressure is elevated.  She is going to bring her cuff to Dr. Olin Pia office to see how it correlates.  Has had measurements over 200 at home.  I suspect will need more medication.  Will defer to him. ? ?She is on aspirin and statins.  I am not sure.  I do not know if there is evidence of coronary disease last LDL on statin is a 74.  I will asked Dr. Caryl Comes to consider calcium scoring to determine whether these medications are necessary. ? ?

## 2022-03-08 ENCOUNTER — Encounter: Payer: Self-pay | Admitting: Dermatology

## 2022-03-08 DIAGNOSIS — M542 Cervicalgia: Secondary | ICD-10-CM | POA: Diagnosis not present

## 2022-03-08 DIAGNOSIS — M5451 Vertebrogenic low back pain: Secondary | ICD-10-CM | POA: Diagnosis not present

## 2022-03-08 DIAGNOSIS — M9901 Segmental and somatic dysfunction of cervical region: Secondary | ICD-10-CM | POA: Diagnosis not present

## 2022-03-08 DIAGNOSIS — M9903 Segmental and somatic dysfunction of lumbar region: Secondary | ICD-10-CM | POA: Diagnosis not present

## 2022-03-08 DIAGNOSIS — M461 Sacroiliitis, not elsewhere classified: Secondary | ICD-10-CM | POA: Diagnosis not present

## 2022-03-08 DIAGNOSIS — M9904 Segmental and somatic dysfunction of sacral region: Secondary | ICD-10-CM | POA: Diagnosis not present

## 2022-03-09 DIAGNOSIS — E538 Deficiency of other specified B group vitamins: Secondary | ICD-10-CM | POA: Diagnosis not present

## 2022-03-09 DIAGNOSIS — N1831 Chronic kidney disease, stage 3a: Secondary | ICD-10-CM | POA: Diagnosis not present

## 2022-03-09 DIAGNOSIS — I10 Essential (primary) hypertension: Secondary | ICD-10-CM | POA: Diagnosis not present

## 2022-03-09 DIAGNOSIS — E785 Hyperlipidemia, unspecified: Secondary | ICD-10-CM | POA: Diagnosis not present

## 2022-03-16 DIAGNOSIS — G4733 Obstructive sleep apnea (adult) (pediatric): Secondary | ICD-10-CM | POA: Diagnosis not present

## 2022-03-16 DIAGNOSIS — F3342 Major depressive disorder, recurrent, in full remission: Secondary | ICD-10-CM | POA: Diagnosis not present

## 2022-03-16 DIAGNOSIS — Z79899 Other long term (current) drug therapy: Secondary | ICD-10-CM | POA: Diagnosis not present

## 2022-03-16 DIAGNOSIS — E785 Hyperlipidemia, unspecified: Secondary | ICD-10-CM | POA: Diagnosis not present

## 2022-03-16 DIAGNOSIS — M519 Unspecified thoracic, thoracolumbar and lumbosacral intervertebral disc disorder: Secondary | ICD-10-CM | POA: Diagnosis not present

## 2022-03-16 DIAGNOSIS — I4581 Long QT syndrome: Secondary | ICD-10-CM | POA: Diagnosis not present

## 2022-03-16 DIAGNOSIS — K219 Gastro-esophageal reflux disease without esophagitis: Secondary | ICD-10-CM | POA: Diagnosis not present

## 2022-03-16 DIAGNOSIS — I480 Paroxysmal atrial fibrillation: Secondary | ICD-10-CM | POA: Diagnosis not present

## 2022-03-16 DIAGNOSIS — N1831 Chronic kidney disease, stage 3a: Secondary | ICD-10-CM | POA: Diagnosis not present

## 2022-03-16 DIAGNOSIS — Z0000 Encounter for general adult medical examination without abnormal findings: Secondary | ICD-10-CM | POA: Diagnosis not present

## 2022-03-16 DIAGNOSIS — I10 Essential (primary) hypertension: Secondary | ICD-10-CM | POA: Diagnosis not present

## 2022-03-16 DIAGNOSIS — Z0389 Encounter for observation for other suspected diseases and conditions ruled out: Secondary | ICD-10-CM | POA: Diagnosis not present

## 2022-03-16 DIAGNOSIS — E538 Deficiency of other specified B group vitamins: Secondary | ICD-10-CM | POA: Diagnosis not present

## 2022-07-08 DIAGNOSIS — Z1211 Encounter for screening for malignant neoplasm of colon: Secondary | ICD-10-CM | POA: Diagnosis not present

## 2022-07-08 DIAGNOSIS — K219 Gastro-esophageal reflux disease without esophagitis: Secondary | ICD-10-CM | POA: Diagnosis not present

## 2022-09-09 DIAGNOSIS — E538 Deficiency of other specified B group vitamins: Secondary | ICD-10-CM | POA: Diagnosis not present

## 2022-09-09 DIAGNOSIS — N1831 Chronic kidney disease, stage 3a: Secondary | ICD-10-CM | POA: Diagnosis not present

## 2022-09-09 DIAGNOSIS — I10 Essential (primary) hypertension: Secondary | ICD-10-CM | POA: Diagnosis not present

## 2022-09-09 DIAGNOSIS — E785 Hyperlipidemia, unspecified: Secondary | ICD-10-CM | POA: Diagnosis not present

## 2022-09-16 ENCOUNTER — Ambulatory Visit: Payer: PPO | Attending: Internal Medicine | Admitting: Internal Medicine

## 2022-09-16 ENCOUNTER — Encounter: Payer: Self-pay | Admitting: Internal Medicine

## 2022-09-16 VITALS — BP 158/66 | HR 62 | Ht 64.0 in | Wt 201.1 lb

## 2022-09-16 DIAGNOSIS — I10 Essential (primary) hypertension: Secondary | ICD-10-CM | POA: Diagnosis not present

## 2022-09-16 DIAGNOSIS — G4733 Obstructive sleep apnea (adult) (pediatric): Secondary | ICD-10-CM | POA: Diagnosis not present

## 2022-09-16 DIAGNOSIS — I493 Ventricular premature depolarization: Secondary | ICD-10-CM

## 2022-09-16 DIAGNOSIS — F3342 Major depressive disorder, recurrent, in full remission: Secondary | ICD-10-CM | POA: Diagnosis not present

## 2022-09-16 DIAGNOSIS — E538 Deficiency of other specified B group vitamins: Secondary | ICD-10-CM | POA: Diagnosis not present

## 2022-09-16 DIAGNOSIS — K219 Gastro-esophageal reflux disease without esophagitis: Secondary | ICD-10-CM | POA: Diagnosis not present

## 2022-09-16 DIAGNOSIS — I480 Paroxysmal atrial fibrillation: Secondary | ICD-10-CM | POA: Diagnosis not present

## 2022-09-16 DIAGNOSIS — N1831 Chronic kidney disease, stage 3a: Secondary | ICD-10-CM | POA: Diagnosis not present

## 2022-09-16 DIAGNOSIS — E785 Hyperlipidemia, unspecified: Secondary | ICD-10-CM | POA: Diagnosis not present

## 2022-09-16 DIAGNOSIS — Z79899 Other long term (current) drug therapy: Secondary | ICD-10-CM

## 2022-09-16 DIAGNOSIS — J8410 Pulmonary fibrosis, unspecified: Secondary | ICD-10-CM | POA: Diagnosis not present

## 2022-09-16 DIAGNOSIS — I4581 Long QT syndrome: Secondary | ICD-10-CM | POA: Diagnosis not present

## 2022-09-16 DIAGNOSIS — I491 Atrial premature depolarization: Secondary | ICD-10-CM

## 2022-09-16 NOTE — Progress Notes (Signed)
Patient Care Team: Adin Hector, MD as PCP - General (Internal Medicine)   HPI  Christina Underwood is a 76 y.o. female In follow-up for symptomatic PACs and PVCs treated with amiodarone--low-dose therapy for some years, taking 100 mg 3 days a week. Surveillance laboratories have been followed by Dr. Roney Marion.  Patient denies symptoms of respiratory, GI intolerance, sun sensitivity, neurological symptoms attributable to amiodarone.   Chronic stable cough   The patient denies chest pain, shortness of breath, nocturnal dyspnea, orthopnea or peripheral edema.  There have been no palpitations, lightheadedness or syncope.Marland Kitchen     DATE TEST EF   2008 Cath  No CAD  1/16 Myoview  75 % No ischemia  10/19 Myoview 75% No Ischemia     Date Cr K TSH LFT (ALT ) LDL  2/19   2.92 15   8/19    2.72 17   2/21 1.0 4.2 1.597 20   3/22 1.1 4.1 3.04 31   9/22 1.1 4.1  28<<40 74  9/23 1.0 3.9 2.885 18           Past Medical History:  Diagnosis Date   Abdominal discomfort    R upper quad   Actinic keratosis    Atypical chest pain    Basal cell carcinoma 10/24/2019   Left medial cheek/lateral to nose 3cm inferior to left medial canthus. Nodular pattern. Excised 12/04/2019, margins free.   Breast lump    CAD (coronary artery disease)    25% RCA 2008    Depression    GERD (gastroesophageal reflux disease)    HLD (hyperlipidemia)    HTN (hypertension)    Hypokalemia    Long QT interval    assoc with amiodarone   PAC (premature atrial contraction)    Right ventricular outflow tract premature ventricular contractions (PVCs)    Sleep apnea     Past Surgical History:  Procedure Laterality Date   APPENDECTOMY     bunion repairs     bilateral    CARDIAC CATHETERIZATION     [facility].   CATARACT EXTRACTION Right    COLONOSCOPY     lumbar laminextomy  2001   TOTAL ABDOMINAL HYSTERECTOMY W/ BILATERAL SALPINGOOPHORECTOMY     VESICOVAGINAL  FISTULA CLOSURE W/ TAH      Current Outpatient Medications  Medication Sig Dispense Refill   amiodarone (PACERONE) 200 MG tablet Take half tablet ('100mg'$ ) by mouth daily.     aspirin 81 MG EC tablet Take 81 mg by mouth daily.     atorvastatin (LIPITOR) 40 MG tablet Take 40 mg by mouth at bedtime.     calcium carbonate (OS-CAL) 600 MG TABS tablet Take 600 mg by mouth daily with breakfast.     cetirizine (ZYRTEC) 10 MG tablet Take 10 mg by mouth daily.     escitalopram (LEXAPRO) 10 MG tablet Take 10 mg by mouth at bedtime.      gabapentin (NEURONTIN) 300 MG capsule TAKE 3 CAPSULES BY MOUTH 90 MINUTES PRIOR TO BEDTIME     pantoprazole (PROTONIX) 40 MG tablet TAKE 1 TABLET(40 MG) BY MOUTH EVERY DAY     valsartan-hydrochlorothiazide (DIOVAN-HCT) 320-25 MG per tablet Take 1 tablet by mouth daily.      No current facility-administered medications for this visit.    Allergies  Allergen Reactions   Sulfonamide Derivatives    Sulfa Antibiotics Itching and Rash    Review of Systems negative  except from HPI and PMH  Physical Exam BP (!) 158/66 (BP Location: Left Arm, Patient Position: Sitting, Cuff Size: Normal) Comment: 5 min BP  Pulse 62   Ht '5\' 4"'$  (1.626 m)   Wt 201 lb 2 oz (91.2 kg)   SpO2 94%   BMI 34.52 kg/m  Well developed and .obese in no acute distress HENT normal Neck supple with JVP-flat Clear Regular rate and rhythm, no  gallop No  murmur Abd-soft with active BS No Clubbing cyanosis  edema Skin-warm and dry A & Oriented  Grossly normal sensory and motor function  ECG sinus @ 62  21/07/43      Assessment and  Plan  PACs/PVCs-symptomatic  Amiodarone therapy-high risk medication   Cardiomyopathy recovered  Sinus bradycardia/1 AVB  Hypertension  Non vaccinator Tolerating the amiodarone.  No palpitations.  Continue 100 mg daily.  Surveillance laboratories normal  Blood pressure remains elevated.  She is to see her PCP in the next half an hour, I have sent him  a message by way of her that we should increase her antihypertensives.  Apparently in the past amlodipine 2.5?  Dose?  Was associated with hypotension.  Will defer decisions to Dr. Caryn Section  I also wonder whether she might not benefit from restratification with calcium scoring to determine her need for statin and aspirin therapy.  I will ask her to discuss this with him also

## 2022-09-16 NOTE — Patient Instructions (Signed)
Medication Instructions:  - Your physician recommends that you continue on your current medications as directed. Please refer to the Current Medication list given to you today.  *If you need a refill on your cardiac medications before your next appointment, please call your pharmacy*   Lab Work: - none ordered  If you have labs (blood work) drawn today and your tests are completely normal, you will receive your results only by: MyChart Message (if you have MyChart) OR A paper copy in the mail If you have any lab test that is abnormal or we need to change your treatment, we will call you to review the results.   Testing/Procedures: - none ordered   Follow-Up: At [facility], you and your health needs are our priority.  As part of our continuing mission to provide you with exceptional heart care, we have created designated Provider Care Teams.  These Care Teams include your primary Cardiologist (physician) and Advanced Practice Providers (APPs -  Physician Assistants and Nurse Practitioners) who all work together to provide you with the care you need, when you need it.  We recommend signing up for the patient portal called "MyChart".  Sign up information is provided on this After Visit Summary.  MyChart is used to connect with patients for Virtual Visits (Telemedicine).  Patients are able to view lab/test results, encounter notes, upcoming appointments, etc.  Non-urgent messages can be sent to your provider as well.   To learn more about what you can do with MyChart, go to [email or web address].    Your next appointment:   1 year(s)  The format for your next appointment:   In Person  Provider:   Steven Klein, MD    Other Instructions N/a  Important Information About Sugar       

## 2022-09-17 ENCOUNTER — Other Ambulatory Visit: Payer: Self-pay | Admitting: Internal Medicine

## 2022-09-17 DIAGNOSIS — E785 Hyperlipidemia, unspecified: Secondary | ICD-10-CM

## 2022-09-17 DIAGNOSIS — I10 Essential (primary) hypertension: Secondary | ICD-10-CM

## 2022-09-17 DIAGNOSIS — I480 Paroxysmal atrial fibrillation: Secondary | ICD-10-CM

## 2022-09-29 ENCOUNTER — Ambulatory Visit
Admission: RE | Admit: 2022-09-29 | Discharge: 2022-09-29 | Disposition: A | Discharge status: Home / Self Care | Payer: No Typology Code available for payment source | Source: Ambulatory Visit | Attending: Internal Medicine | Admitting: Internal Medicine

## 2022-09-29 DIAGNOSIS — I480 Paroxysmal atrial fibrillation: Secondary | ICD-10-CM

## 2022-09-29 DIAGNOSIS — I700 Atherosclerosis of aorta: Secondary | ICD-10-CM | POA: Diagnosis not present

## 2022-09-29 DIAGNOSIS — I10 Essential (primary) hypertension: Secondary | ICD-10-CM

## 2022-09-29 DIAGNOSIS — E785 Hyperlipidemia, unspecified: Secondary | ICD-10-CM

## 2022-10-12 ENCOUNTER — Ambulatory Visit: Payer: PPO | Admitting: Anesthesiology

## 2022-10-12 ENCOUNTER — Encounter
Admission: RE | Disposition: A | Discharge status: Home / Self Care | Payer: Self-pay | Source: Home / Self Care | Attending: Gastroenterology

## 2022-10-12 ENCOUNTER — Encounter: Payer: Self-pay | Admitting: *Deleted

## 2022-10-12 ENCOUNTER — Ambulatory Visit
Admission: RE | Admit: 2022-10-12 | Discharge: 2022-10-12 | Disposition: A | Discharge status: Home / Self Care | Payer: PPO | Attending: Gastroenterology | Admitting: Gastroenterology

## 2022-10-12 DIAGNOSIS — I10 Essential (primary) hypertension: Secondary | ICD-10-CM | POA: Insufficient documentation

## 2022-10-12 DIAGNOSIS — I2510 Atherosclerotic heart disease of native coronary artery without angina pectoris: Secondary | ICD-10-CM | POA: Insufficient documentation

## 2022-10-12 DIAGNOSIS — Z1211 Encounter for screening for malignant neoplasm of colon: Secondary | ICD-10-CM | POA: Insufficient documentation

## 2022-10-12 DIAGNOSIS — Z9049 Acquired absence of other specified parts of digestive tract: Secondary | ICD-10-CM | POA: Diagnosis not present

## 2022-10-12 DIAGNOSIS — Z90710 Acquired absence of both cervix and uterus: Secondary | ICD-10-CM | POA: Diagnosis not present

## 2022-10-12 DIAGNOSIS — G4730 Sleep apnea, unspecified: Secondary | ICD-10-CM | POA: Insufficient documentation

## 2022-10-12 DIAGNOSIS — F32A Depression, unspecified: Secondary | ICD-10-CM | POA: Insufficient documentation

## 2022-10-12 DIAGNOSIS — K219 Gastro-esophageal reflux disease without esophagitis: Secondary | ICD-10-CM | POA: Diagnosis not present

## 2022-10-12 DIAGNOSIS — K635 Polyp of colon: Secondary | ICD-10-CM | POA: Diagnosis not present

## 2022-10-12 DIAGNOSIS — D124 Benign neoplasm of descending colon: Secondary | ICD-10-CM | POA: Diagnosis not present

## 2022-10-12 DIAGNOSIS — K649 Unspecified hemorrhoids: Secondary | ICD-10-CM | POA: Diagnosis not present

## 2022-10-12 DIAGNOSIS — K640 First degree hemorrhoids: Secondary | ICD-10-CM | POA: Diagnosis not present

## 2022-10-12 DIAGNOSIS — E785 Hyperlipidemia, unspecified: Secondary | ICD-10-CM | POA: Diagnosis not present

## 2022-10-12 HISTORY — PX: COLONOSCOPY WITH PROPOFOL: SHX5780

## 2022-10-12 SURGERY — COLONOSCOPY WITH PROPOFOL
Anesthesia: General

## 2022-10-12 MED ORDER — LIDOCAINE 2% (20 MG/ML) 5 ML SYRINGE
INTRAMUSCULAR | Status: DC | PRN
  Administered 2022-10-12: 20 mg via INTRAVENOUS

## 2022-10-12 MED ORDER — PROPOFOL 10 MG/ML IV BOLUS
INTRAVENOUS | Status: DC | PRN
  Administered 2022-10-12: 70 mg via INTRAVENOUS
  Administered 2022-10-12: 30 mg via INTRAVENOUS

## 2022-10-12 MED ORDER — PROPOFOL 500 MG/50ML IV EMUL
INTRAVENOUS | Status: DC | PRN
  Administered 2022-10-12: 120 ug/kg/min via INTRAVENOUS

## 2022-10-12 MED ORDER — SODIUM CHLORIDE 0.9 % IV SOLN: INTRAVENOUS | Status: DC

## 2022-10-12 NOTE — H&P (Signed)
Outpatient short stay form Pre-procedure 10/12/2022  Lesly Rubenstein, MD  Primary Physician: Adin Hector, MD  Reason for visit:  Screening  History of present illness:    76 y/o lady with history of hypertension, HLD, and a. Fib on amiodarone here for screening colonoscopy. Last colonoscopy was normal 10 years ago. No blood thinners. No family history of GI malignancies. History of hysterectomy and appendectomy.    Current Facility-Administered Medications:    0.9 %  sodium chloride infusion, , Intravenous, Continuous, Doshia Dalia, Hilton Cork, MD, Last Rate: 20 mL/hr at 10/12/22 1027, New Bag at 10/12/22 1027  Medications Prior to Admission  Medication Sig Dispense Refill Last Dose   amiodarone (PACERONE) 200 MG tablet Take half tablet ('100mg'$ ) by mouth daily.   10/12/2022   aspirin 81 MG EC tablet Take 81 mg by mouth daily.   10/11/2022   valsartan-hydrochlorothiazide (DIOVAN-HCT) 320-25 MG per tablet Take 1 tablet by mouth daily.    10/12/2022   atorvastatin (LIPITOR) 40 MG tablet Take 40 mg by mouth at bedtime.      calcium carbonate (OS-CAL) 600 MG TABS tablet Take 600 mg by mouth daily with breakfast.      cetirizine (ZYRTEC) 10 MG tablet Take 10 mg by mouth daily.      escitalopram (LEXAPRO) 10 MG tablet Take 10 mg by mouth at bedtime.       gabapentin (NEURONTIN) 300 MG capsule TAKE 3 CAPSULES BY MOUTH 90 MINUTES PRIOR TO BEDTIME      pantoprazole (PROTONIX) 40 MG tablet TAKE 1 TABLET(40 MG) BY MOUTH EVERY DAY        Allergies  Allergen Reactions   Sulfonamide Derivatives    Sulfa Antibiotics Itching and Rash     Past Medical History:  Diagnosis Date   Abdominal discomfort    R upper quad   Actinic keratosis    Atypical chest pain    Basal cell carcinoma 10/24/2019   Left medial cheek/lateral to nose 3cm inferior to left medial canthus. Nodular pattern. Excised 12/04/2019, margins free.   Breast lump    CAD (coronary artery disease)    25% RCA 2008     Depression    GERD (gastroesophageal reflux disease)    HLD (hyperlipidemia)    HTN (hypertension)    Hypokalemia    Long QT interval    assoc with amiodarone   PAC (premature atrial contraction)    Right ventricular outflow tract premature ventricular contractions (PVCs)    Sleep apnea     Review of systems:  Otherwise negative.    Physical Exam  Gen: Alert, oriented. Appears stated age.  HEENT: PERRLA. Lungs: No respiratory distress CV: RRR Abd: soft, benign, no masses Ext: No edema    Planned procedures: Proceed with colonoscopy. The patient understands the nature of the planned procedure, indications, risks, alternatives and potential complications including but not limited to bleeding, infection, perforation, damage to internal organs and possible oversedation/side effects from anesthesia. The patient agrees and gives consent to proceed.  Please refer to procedure notes for findings, recommendations and patient disposition/instructions.     Lesly Rubenstein, MD [facility]

## 2022-10-12 NOTE — Transfer of Care (Signed)
Immediate Anesthesia Transfer of Care Note  Patient: Nazaria Riesen Fetterman  Procedure(s) Performed: COLONOSCOPY WITH PROPOFOL  Patient Location: Endoscopy Unit  Anesthesia Type:General  Level of Consciousness: awake and drowsy  Airway & Oxygen Therapy: Patient Spontanous Breathing  Post-op Assessment: Report given to RN and Post -op Vital signs reviewed and stable  Post vital signs: Reviewed  Last Vitals:  Vitals Value Taken Time  BP 85/43 10/12/22 1203  Temp    Pulse 44 10/12/22 1203  Resp 14 10/12/22 1203  SpO2 96 % 10/12/22 1203  Vitals shown include unvalidated device data.  Last Pain:  Vitals:   10/12/22 1202  TempSrc: Temporal  PainSc: Asleep         Complications: No notable events documented.

## 2022-10-12 NOTE — Interval H&P Note (Signed)
History and Physical Interval Note:  10/12/2022 11:33 AM  Christina Underwood  has presented today for surgery, with the diagnosis of Colon Cancer Screening.  The various methods of treatment have been discussed with the patient and family. After consideration of risks, benefits and other options for treatment, the patient has consented to  Procedure(s): COLONOSCOPY WITH PROPOFOL (N/A) as a surgical intervention.  The patient's history has been reviewed, patient examined, no change in status, stable for surgery.  I have reviewed the patient's chart and labs.  Questions were answered to the patient's satisfaction.     Lesly Rubenstein  Ok to proceed with colonoscopy

## 2022-10-12 NOTE — Anesthesia Preprocedure Evaluation (Addendum)
Anesthesia Evaluation  Patient identified by MRN, date of birth, ID band Patient awake    Reviewed: Allergy & Precautions, NPO status , Patient's Chart, lab work & pertinent test results  Airway Mallampati: III  TM Distance: >3 FB Neck ROM: full    Dental no notable dental hx.    Pulmonary sleep apnea ,    Pulmonary exam normal        Cardiovascular hypertension, + CAD  Normal cardiovascular exam+ dysrhythmias (Right ventricular outflow tract premature ventricular contractions)  Rhythm:Regular  Long QT interval  assoc with amiodarone    Neuro/Psych negative neurological ROS  negative psych ROS   GI/Hepatic Neg liver ROS, GERD  ,  Endo/Other  negative endocrine ROS  Renal/GU Renal InsufficiencyRenal disease  negative genitourinary   Musculoskeletal   Abdominal (+) + obese,   Peds  Hematology negative hematology ROS (+)   Anesthesia Other Findings Past Medical History: No date: Abdominal discomfort     Comment:  R upper quad No date: Actinic keratosis No date: Atypical chest pain 10/24/2019: Basal cell carcinoma     Comment:  Left medial cheek/lateral to nose 3cm inferior to left               medial canthus. Nodular pattern. Excised 12/04/2019,               margins free. No date: Breast lump No date: CAD (coronary artery disease)     Comment:  25% RCA 2008  No date: Depression No date: GERD (gastroesophageal reflux disease) No date: HLD (hyperlipidemia) No date: HTN (hypertension) No date: Hypokalemia No date: Long QT interval     Comment:  assoc with amiodarone No date: PAC (premature atrial contraction) No date: Right ventricular outflow tract premature ventricular  contractions (PVCs) No date: Sleep apnea  Past Surgical History: No date: APPENDECTOMY No date: bunion repairs     Comment:  bilateral  No date: CARDIAC CATHETERIZATION     Comment:  [facility] No date: CARDIAC CATHETERIZATION     Comment:   [facility] No date: CATARACT EXTRACTION; Right No date: COLONOSCOPY 2001: lumbar laminextomy No date: TOTAL ABDOMINAL HYSTERECTOMY W/ BILATERAL SALPINGOOPHORECTOMY No date: VESICOVAGINAL FISTULA CLOSURE W/ TAH     Reproductive/Obstetrics negative OB ROS                           Anesthesia Physical Anesthesia Plan  ASA: 3  Anesthesia Plan: General   Post-op Pain Management: Minimal or no pain anticipated   Induction: Intravenous  PONV Risk Score and Plan: Propofol infusion and TIVA  Airway Management Planned: Natural Airway  Additional Equipment:   Intra-op Plan:   Post-operative Plan:   Informed Consent: I have reviewed the patients History and Physical, chart, labs and discussed the procedure including the risks, benefits and alternatives for the proposed anesthesia with the patient or authorized representative who has indicated his/her understanding and acceptance.     Dental Advisory Given  Plan Discussed with: Anesthesiologist, CRNA and Surgeon  Anesthesia Plan Comments:        Anesthesia Quick Evaluation

## 2022-10-12 NOTE — Op Note (Signed)
[facility]astro[facility] Patient Name: Christina Underwood Procedure Date: 10/12/2022 11:21 AM MRN: [id number] Account #: [id number] Date of Birth: 1946/10/08 Admit Type: Outpatient Age: 76 Room: [facility] ENDO ROOM 1 Gender: Female Note Status: Finalized Instrument Name: Jasper Riling [id number] Procedure:             Colonoscopy Indications:           Screening for colorectal malignant neoplasm Providers:             Andrey Farmer MD, MD Referring MD:          Ramonita Lab, MD (Referring MD) Medicines:             Monitored Anesthesia Care Complications:         No immediate complications. Estimated blood loss:                         Minimal. Procedure:             Pre-Anesthesia Assessment:                        - Prior to the procedure, a History and Physical was                         performed, and patient medications and allergies were                         reviewed. The patient is competent. The risks and                         benefits of the procedure and the sedation options and                         risks were discussed with the patient. All questions                         were answered and informed consent was obtained.                         Patient identification and proposed procedure were                         verified by the physician, the nurse, the                         anesthesiologist, the anesthetist and the technician                         in the endoscopy suite. Mental Status Examination:                         alert and oriented. Airway Examination: normal                         oropharyngeal airway and neck mobility. Respiratory                         Examination: clear to auscultation. CV Examination:  normal. Prophylactic Antibiotics: The patient does not                         require prophylactic antibiotics. Prior                         Anticoagulants: The patient has taken no previous                          anticoagulant or antiplatelet agents. ASA Grade                         Assessment: III - A patient with severe systemic                         disease. After reviewing the risks and benefits, the                         patient was deemed in satisfactory condition to                         undergo the procedure. The anesthesia plan was to use                         monitored anesthesia care (MAC). Immediately prior to                         administration of medications, the patient was                         re-assessed for adequacy to receive sedatives. The                         heart rate, respiratory rate, oxygen saturations,                         blood pressure, adequacy of pulmonary ventilation, and                         response to care were monitored throughout the                         procedure. The physical status of the patient was                         re-assessed after the procedure.                        After obtaining informed consent, the colonoscope was                         passed under direct vision. Throughout the procedure,                         the patient's blood pressure, pulse, and oxygen                         saturations were monitored continuously. The  Colonoscope was introduced through the anus and                         advanced to the the cecum, identified by appendiceal                         orifice and ileocecal valve. The colonoscopy was                         performed without difficulty. The patient tolerated                         the procedure well. The quality of the bowel                         preparation was good. Findings:      The perianal and digital rectal examinations were normal.      A 3 mm polyp was found in the descending colon. The polyp was sessile.       The polyp was removed with a cold snare. Resection and retrieval were       complete. Estimated blood loss was  minimal.      Internal hemorrhoids were found during retroflexion. The hemorrhoids       were Grade I (internal hemorrhoids that do not prolapse).      The exam was otherwise without abnormality on direct and retroflexion       views. Impression:            - One 3 mm polyp in the descending colon, removed with                         a cold snare. Resected and retrieved.                        - Internal hemorrhoids.                        - The examination was otherwise normal on direct and                         retroflexion views. Recommendation:        - Discharge patient to home.                        - Resume previous diet.                        - Continue present medications.                        - Await pathology results.                        - Repeat colonoscopy is not recommended due to current                         age (63 years or older) for surveillance.                        - Return to referring physician as previously  scheduled. Procedure Code(s):     --- Professional ---                        [phone], Colonoscopy, flexible; with removal of                         tumor(s), polyp(s), or other lesion(s) by snare                         technique Diagnosis Code(s):     --- Professional ---                        Z12.11, Encounter for screening for malignant neoplasm                         of colon                        K63.5, Polyp of colon                        K64.0, First degree hemorrhoids CPT copyright 2019 American Medical Association. All rights reserved. The codes documented in this report are preliminary and upon coder review may  be revised to meet current compliance requirements. Andrey Farmer MD, MD 10/12/2022 12:06:37 PM Number of Addenda: 0 Note Initiated On: 10/12/2022 11:21 AM Scope Withdrawal Time: 0 hours 7 minutes 26 seconds  Total Procedure Duration: 0 hours 15 minutes 27 seconds  Estimated Blood Loss:   Estimated blood loss was minimal.      [facility]

## 2022-10-13 ENCOUNTER — Encounter: Payer: Self-pay | Admitting: Gastroenterology

## 2022-10-13 LAB — SURGICAL PATHOLOGY

## 2022-10-13 NOTE — Anesthesia Postprocedure Evaluation (Signed)
Anesthesia Post Note  Patient: Christina Underwood  Procedure(s) Performed: COLONOSCOPY WITH PROPOFOL  Patient location during evaluation: PACU Anesthesia Type: General Level of consciousness: awake and alert Pain management: pain level controlled Vital Signs Assessment: post-procedure vital signs reviewed and stable Respiratory status: spontaneous breathing, nonlabored ventilation and respiratory function stable Cardiovascular status: blood pressure returned to baseline and stable Postop Assessment: no apparent nausea or vomiting Anesthetic complications: no   No notable events documented.   Last Vitals:  Vitals:   10/12/22 1212 10/12/22 1222  BP:    Pulse: (!) 52 68  Resp:    Temp:    SpO2: 98% 98%    Last Pain:  Vitals:   10/12/22 1222  TempSrc:   PainSc: 0-No pain                 [place] Ouch

## 2022-11-01 ENCOUNTER — Other Ambulatory Visit: Payer: Self-pay | Admitting: Internal Medicine

## 2022-11-01 DIAGNOSIS — Z1231 Encounter for screening mammogram for malignant neoplasm of breast: Secondary | ICD-10-CM

## 2022-11-09 DIAGNOSIS — I10 Essential (primary) hypertension: Secondary | ICD-10-CM | POA: Diagnosis not present

## 2022-11-09 DIAGNOSIS — J40 Bronchitis, not specified as acute or chronic: Secondary | ICD-10-CM | POA: Diagnosis not present

## 2022-11-09 DIAGNOSIS — I480 Paroxysmal atrial fibrillation: Secondary | ICD-10-CM | POA: Diagnosis not present

## 2022-11-24 ENCOUNTER — Ambulatory Visit
Admission: RE | Admit: 2022-11-24 | Discharge: 2022-11-24 | Disposition: A | Discharge status: Home / Self Care | Payer: PPO | Source: Ambulatory Visit | Attending: Internal Medicine | Admitting: Internal Medicine

## 2022-11-24 DIAGNOSIS — Z1231 Encounter for screening mammogram for malignant neoplasm of breast: Secondary | ICD-10-CM | POA: Insufficient documentation

## 2023-03-07 ENCOUNTER — Ambulatory Visit: Payer: PPO | Admitting: Dermatology

## 2023-03-10 DIAGNOSIS — E785 Hyperlipidemia, unspecified: Secondary | ICD-10-CM | POA: Diagnosis not present

## 2023-03-10 DIAGNOSIS — E538 Deficiency of other specified B group vitamins: Secondary | ICD-10-CM | POA: Diagnosis not present

## 2023-03-10 DIAGNOSIS — N1831 Chronic kidney disease, stage 3a: Secondary | ICD-10-CM | POA: Diagnosis not present

## 2023-03-10 DIAGNOSIS — I10 Essential (primary) hypertension: Secondary | ICD-10-CM | POA: Diagnosis not present

## 2023-03-16 ENCOUNTER — Encounter: Payer: Self-pay | Admitting: Dermatology

## 2023-03-16 ENCOUNTER — Ambulatory Visit: Payer: PPO | Admitting: Dermatology

## 2023-03-16 VITALS — BP 137/61

## 2023-03-16 DIAGNOSIS — Z1283 Encounter for screening for malignant neoplasm of skin: Secondary | ICD-10-CM | POA: Diagnosis not present

## 2023-03-16 DIAGNOSIS — H2512 Age-related nuclear cataract, left eye: Secondary | ICD-10-CM | POA: Diagnosis not present

## 2023-03-16 DIAGNOSIS — D1801 Hemangioma of skin and subcutaneous tissue: Secondary | ICD-10-CM

## 2023-03-16 DIAGNOSIS — L821 Other seborrheic keratosis: Secondary | ICD-10-CM | POA: Diagnosis not present

## 2023-03-16 DIAGNOSIS — Z79899 Other long term (current) drug therapy: Secondary | ICD-10-CM

## 2023-03-16 DIAGNOSIS — L814 Other melanin hyperpigmentation: Secondary | ICD-10-CM

## 2023-03-16 DIAGNOSIS — L578 Other skin changes due to chronic exposure to nonionizing radiation: Secondary | ICD-10-CM

## 2023-03-16 DIAGNOSIS — L219 Seborrheic dermatitis, unspecified: Secondary | ICD-10-CM | POA: Diagnosis not present

## 2023-03-16 DIAGNOSIS — Z961 Presence of intraocular lens: Secondary | ICD-10-CM | POA: Diagnosis not present

## 2023-03-16 DIAGNOSIS — D229 Melanocytic nevi, unspecified: Secondary | ICD-10-CM | POA: Diagnosis not present

## 2023-03-16 DIAGNOSIS — H47322 Drusen of optic disc, left eye: Secondary | ICD-10-CM | POA: Diagnosis not present

## 2023-03-16 DIAGNOSIS — Z85828 Personal history of other malignant neoplasm of skin: Secondary | ICD-10-CM

## 2023-03-16 MED ORDER — KETOCONAZOLE 2 % EX SHAM: 1.0000 | MEDICATED_SHAMPOO | CUTANEOUS | 11 refills | Status: AC

## 2023-03-16 NOTE — Progress Notes (Signed)
   Follow-Up Visit   Subjective  Christina Underwood is a 77 y.o. female who presents for the following: Skin Cancer Screening and Full Body Skin Exam The patient presents for Total-Body Skin Exam (TBSE) for skin cancer screening and mole check. The patient has spots, moles and lesions to be evaluated, some may be new or changing and the patient has concerns that these could be cancer.  The following portions of the chart were reviewed this encounter and updated as appropriate: medications, allergies, medical history  Review of Systems:  No other skin or systemic complaints except as noted in HPI or Assessment and Plan.  Objective  Well appearing patient in no apparent distress; mood and affect are within normal limits.  A full examination was performed including scalp, head, eyes, ears, nose, lips, neck, chest, axillae, abdomen, back, buttocks, bilateral upper extremities, bilateral lower extremities, hands, feet, fingers, toes, fingernails, and toenails. All findings within normal limits unless otherwise noted below.   Relevant physical exam findings are noted in the Assessment and Plan.  Scalp Pinkness and scale   Assessment & Plan   HISTORY OF BASAL CELL CARCINOMA OF THE SKIN - No evidence of recurrence today - Recommend regular full body skin exams - Recommend daily broad spectrum sunscreen SPF 30+ to sun-exposed areas, reapply every 2 hours as needed.  - Call if any new or changing lesions are noted between office visits  LENTIGINES, SEBORRHEIC KERATOSES, HEMANGIOMAS - Benign normal skin lesions - Benign-appearing - Call for any changes  MELANOCYTIC NEVI - Tan-brown and/or pink-flesh-colored symmetric macules and papules - Benign appearing on exam today - Observation - Call clinic for new or changing moles - Recommend daily use of broad spectrum spf 30+ sunscreen to sun-exposed areas.   ACTINIC DAMAGE - Chronic condition, secondary to cumulative UV/sun exposure - diffuse  scaly erythematous macules with underlying dyspigmentation - Recommend daily broad spectrum sunscreen SPF 30+ to sun-exposed areas, reapply every 2 hours as needed.  - Staying in the shade or wearing long sleeves, sun glasses (UVA+UVB protection) and wide brim hats (4-inch brim around the entire circumference of the hat) are also recommended for sun protection.  - Call for new or changing lesions.  SKIN CANCER SCREENING PERFORMED TODAY.  Seborrheic dermatitis Scalp  Seborrheic Dermatitis  -  is a chronic persistent rash characterized by pinkness and scaling most commonly of the mid face but also can occur on the scalp (dandruff), ears; mid chest, mid back and groin.  It tends to be exacerbated by stress and cooler weather.  People who have neurologic disease may experience new onset or exacerbation of existing seborrheic dermatitis.  The condition is not curable but treatable and can be controlled.  Start Ketoconazole 2% shampoo 3 times per week. Recommend Sal Acid shampoo 2-3 times per week  ketoconazole (NIZORAL) 2 % shampoo - Scalp Apply 1 Application topically 3 (three) times a week.   Return in about 1 year (around 03/15/2024) for TBSE.  I, Ashok Cordia, CMA, am acting as scribe for Sarina Ser, MD .  Documentation: I have reviewed the above documentation for accuracy and completeness, and I agree with the above.  Sarina Ser, MD

## 2023-03-16 NOTE — Patient Instructions (Addendum)
Due to recent changes in healthcare laws, you may see results of your pathology and/or laboratory studies on MyChart before the doctors have had a chance to review them. We understand that in some cases there may be results that are confusing or concerning to you. Please understand that not all results are received at the same time and often the doctors may need to interpret multiple results in order to provide you with the best plan of care or course of treatment. Therefore, we ask that you please give us 2 business days to thoroughly review all your results before contacting the office for clarification. Should we see a critical lab result, you will be contacted sooner.   If You Need Anything After Your Visit  If you have any questions or concerns for your doctor, please call our main line at [phone] and press option 4 to reach your doctor's medical assistant. If no one answers, please leave a voicemail as directed and we will return your call as soon as possible. Messages left after 4 pm will be answered the following business day.   You may also send us a message via MyChart. We typically respond to MyChart messages within 1-2 business days.  For prescription refills, please ask your pharmacy to contact our office. Our fax number is [phone].  If you have an urgent issue when the clinic is closed that cannot wait until the next business day, you can page your doctor at the number below.    Please note that while we do our best to be available for urgent issues outside of office hours, we are not available 24/7.   If you have an urgent issue and are unable to reach us, you may choose to seek medical care at your doctor's office, retail clinic, urgent care center, or emergency room.  If you have a medical emergency, please immediately call 911 or go to the emergency department.  Pager Numbers  - Dr. Kowalski: [phone]  - Dr. Moye: [phone]  - Dr. Stewart:  [phone]  In the event of inclement weather, please call our main line at [phone] for an update on the status of any delays or closures.  Dermatology Medication Tips: Please keep the boxes that topical medications come in in order to help keep track of the instructions about where and how to use these. Pharmacies typically print the medication instructions only on the boxes and not directly on the medication tubes.   If your medication is too expensive, please contact our office at [phone] option 4 or send us a message through MyChart.   We are unable to tell what your co-pay for medications will be in advance as this is different depending on your insurance coverage. However, we may be able to find a substitute medication at lower cost or fill out paperwork to get insurance to cover a needed medication.   If a prior authorization is required to get your medication covered by your insurance company, please allow us 1-2 business days to complete this process.  Drug prices often vary depending on where the prescription is filled and some pharmacies may offer cheaper prices.  The website www.goodrx.com contains coupons for medications through different pharmacies. The prices here do not account for what the cost may be with help from insurance (it may be cheaper with your insurance), but the website can give you the price if you did not use any insurance.  - You can print the associated coupon and take it with   your prescription to the pharmacy.  - You may also stop by our office during regular business hours and pick up a GoodRx coupon card.  - If you need your prescription sent electronically to a different pharmacy, notify our office through [place] MyChart or by phone at [phone] option 4.     Si Usted Necesita Algo Despus de Su Visita  Tambin puede enviarnos un mensaje a travs de MyChart. Por lo general respondemos a los mensajes de MyChart en el transcurso de 1 a 2  das hbiles.  Para renovar recetas, por favor pida a su farmacia que se ponga en contacto con nuestra oficina. Nuestro nmero de fax es el [phone].  Si tiene un asunto urgente cuando la clnica est cerrada y que no puede esperar hasta el siguiente da hbil, puede llamar/localizar a su doctor(a) al nmero que aparece a continuacin.   Por favor, tenga en cuenta que aunque hacemos todo lo posible para estar disponibles para asuntos urgentes fuera del horario de oficina, no estamos disponibles las 24 horas del da, los 7 das de la semana.   Si tiene un problema urgente y no puede comunicarse con nosotros, puede optar por buscar atencin mdica  en el consultorio de su doctor(a), en una clnica privada, en un centro de atencin urgente o en una sala de emergencias.  Si tiene una emergencia mdica, por favor llame inmediatamente al 911 o vaya a la sala de emergencias.  Nmeros de bper  - Dr. Kowalski: [phone]  - Dra. Moye: [phone]  - Dra. Stewart: [phone]  En caso de inclemencias del tiempo, por favor llame a nuestra lnea principal al [phone] para una actualizacin sobre el estado de cualquier retraso o cierre.  Consejos para la medicacin en dermatologa: Por favor, guarde las cajas en las que vienen los medicamentos de uso tpico para ayudarle a seguir las instrucciones sobre dnde y cmo usarlos. Las farmacias generalmente imprimen las instrucciones del medicamento slo en las cajas y no directamente en los tubos del medicamento.   Si su medicamento es muy caro, por favor, pngase en contacto con nuestra oficina llamando al [phone] y presione la opcin 4 o envenos un mensaje a travs de MyChart.   No podemos decirle cul ser su copago por los medicamentos por adelantado ya que esto es diferente dependiendo de la cobertura de su seguro. Sin embargo, es posible que podamos encontrar un medicamento sustituto a menor costo o llenar un formulario para que el  seguro cubra el medicamento que se considera necesario.   Si se requiere una autorizacin previa para que su compaa de seguros cubra su medicamento, por favor permtanos de 1 a 2 das hbiles para completar este proceso.  Los precios de los medicamentos varan con frecuencia dependiendo del lugar de dnde se surte la receta y alguna farmacias pueden ofrecer precios ms baratos.  El sitio web www.goodrx.com tiene cupones para medicamentos de diferentes farmacias. Los precios aqu no tienen en cuenta lo que podra costar con la ayuda del seguro (puede ser ms barato con su seguro), pero el sitio web puede darle el precio si no utiliz ningn seguro.  - Puede imprimir el cupn correspondiente y llevarlo con su receta a la farmacia.  - Tambin puede pasar por nuestra oficina durante el horario de atencin regular y recoger una tarjeta de cupones de GoodRx.  - Si necesita que su receta se enve electrnicamente a una farmacia diferente, informe a nuestra oficina a travs de MyChart de [place]   o por telfono llamando al [phone] y presione la opcin 4.  

## 2023-03-22 DIAGNOSIS — I700 Atherosclerosis of aorta: Secondary | ICD-10-CM | POA: Diagnosis not present

## 2023-03-22 DIAGNOSIS — E785 Hyperlipidemia, unspecified: Secondary | ICD-10-CM | POA: Diagnosis not present

## 2023-03-22 DIAGNOSIS — N1831 Chronic kidney disease, stage 3a: Secondary | ICD-10-CM | POA: Diagnosis not present

## 2023-03-22 DIAGNOSIS — F3342 Major depressive disorder, recurrent, in full remission: Secondary | ICD-10-CM | POA: Diagnosis not present

## 2023-03-22 DIAGNOSIS — Z0000 Encounter for general adult medical examination without abnormal findings: Secondary | ICD-10-CM | POA: Diagnosis not present

## 2023-03-22 DIAGNOSIS — E538 Deficiency of other specified B group vitamins: Secondary | ICD-10-CM | POA: Diagnosis not present

## 2023-03-22 DIAGNOSIS — J8410 Pulmonary fibrosis, unspecified: Secondary | ICD-10-CM | POA: Diagnosis not present

## 2023-03-22 DIAGNOSIS — Z2821 Immunization not carried out because of patient refusal: Secondary | ICD-10-CM | POA: Diagnosis not present

## 2023-03-22 DIAGNOSIS — M519 Unspecified thoracic, thoracolumbar and lumbosacral intervertebral disc disorder: Secondary | ICD-10-CM | POA: Diagnosis not present

## 2023-03-22 DIAGNOSIS — G4733 Obstructive sleep apnea (adult) (pediatric): Secondary | ICD-10-CM | POA: Diagnosis not present

## 2023-03-22 DIAGNOSIS — I480 Paroxysmal atrial fibrillation: Secondary | ICD-10-CM | POA: Diagnosis not present

## 2023-03-22 DIAGNOSIS — I10 Essential (primary) hypertension: Secondary | ICD-10-CM | POA: Diagnosis not present

## 2023-06-21 DIAGNOSIS — I480 Paroxysmal atrial fibrillation: Secondary | ICD-10-CM | POA: Diagnosis not present

## 2023-06-21 DIAGNOSIS — I700 Atherosclerosis of aorta: Secondary | ICD-10-CM | POA: Diagnosis not present

## 2023-06-21 DIAGNOSIS — R21 Rash and other nonspecific skin eruption: Secondary | ICD-10-CM | POA: Diagnosis not present

## 2023-06-21 DIAGNOSIS — N1831 Chronic kidney disease, stage 3a: Secondary | ICD-10-CM | POA: Diagnosis not present

## 2023-09-16 DIAGNOSIS — I10 Essential (primary) hypertension: Secondary | ICD-10-CM | POA: Diagnosis not present

## 2023-09-16 DIAGNOSIS — E538 Deficiency of other specified B group vitamins: Secondary | ICD-10-CM | POA: Diagnosis not present

## 2023-09-16 DIAGNOSIS — N1831 Chronic kidney disease, stage 3a: Secondary | ICD-10-CM | POA: Diagnosis not present

## 2023-09-16 DIAGNOSIS — E785 Hyperlipidemia, unspecified: Secondary | ICD-10-CM | POA: Diagnosis not present

## 2023-09-23 DIAGNOSIS — M519 Unspecified thoracic, thoracolumbar and lumbosacral intervertebral disc disorder: Secondary | ICD-10-CM | POA: Diagnosis not present

## 2023-09-23 DIAGNOSIS — F3342 Major depressive disorder, recurrent, in full remission: Secondary | ICD-10-CM | POA: Diagnosis not present

## 2023-09-23 DIAGNOSIS — I700 Atherosclerosis of aorta: Secondary | ICD-10-CM | POA: Diagnosis not present

## 2023-09-23 DIAGNOSIS — N1831 Chronic kidney disease, stage 3a: Secondary | ICD-10-CM | POA: Diagnosis not present

## 2023-09-23 DIAGNOSIS — E785 Hyperlipidemia, unspecified: Secondary | ICD-10-CM | POA: Diagnosis not present

## 2023-09-23 DIAGNOSIS — G4733 Obstructive sleep apnea (adult) (pediatric): Secondary | ICD-10-CM | POA: Diagnosis not present

## 2023-09-23 DIAGNOSIS — I10 Essential (primary) hypertension: Secondary | ICD-10-CM | POA: Diagnosis not present

## 2023-09-23 DIAGNOSIS — J8410 Pulmonary fibrosis, unspecified: Secondary | ICD-10-CM | POA: Diagnosis not present

## 2023-09-23 DIAGNOSIS — Z2821 Immunization not carried out because of patient refusal: Secondary | ICD-10-CM | POA: Diagnosis not present

## 2023-09-23 DIAGNOSIS — E538 Deficiency of other specified B group vitamins: Secondary | ICD-10-CM | POA: Diagnosis not present

## 2023-09-23 DIAGNOSIS — I480 Paroxysmal atrial fibrillation: Secondary | ICD-10-CM | POA: Diagnosis not present

## 2023-10-10 ENCOUNTER — Ambulatory Visit: Payer: PPO | Attending: Internal Medicine | Admitting: Internal Medicine

## 2023-10-10 ENCOUNTER — Encounter: Payer: Self-pay | Admitting: Internal Medicine

## 2023-10-10 VITALS — BP 144/70 | HR 68 | Ht 62.0 in | Wt 197.8 lb

## 2023-10-10 DIAGNOSIS — I491 Atrial premature depolarization: Secondary | ICD-10-CM

## 2023-10-10 DIAGNOSIS — I493 Ventricular premature depolarization: Secondary | ICD-10-CM | POA: Diagnosis not present

## 2023-10-10 NOTE — Patient Instructions (Signed)
Medication Instructions:  The current medical regimen is effective;  continue present plan and medications.  *If you need a refill on your cardiac medications before your next appointment, please call your pharmacy*   Follow-Up: At [facility], you and your health needs are our priority.  As part of our continuing mission to provide you with exceptional heart care, we have created designated Provider Care Teams.  These Care Teams include your primary Cardiologist (physician) and Advanced Practice Providers (APPs -  Physician Assistants and Nurse Practitioners) who all work together to provide you with the care you need, when you need it.  We recommend signing up for the patient portal called "MyChart".  Sign up information is provided on this After Visit Summary.  MyChart is used to connect with patients for Virtual Visits (Telemedicine).  Patients are able to view lab/test results, encounter notes, upcoming appointments, etc.  Non-urgent messages can be sent to your provider as well.   To learn more about what you can do with MyChart, go to [email or web address].    Your next appointment:   12 month(s)  Provider:   Nobie Putnam, MD

## 2023-10-10 NOTE — Progress Notes (Signed)
Patient Care Team: Lynnea Ferrier, MD as PCP - General (Internal Medicine)   HPI  Christina Underwood is a 77 y.o. female In follow-up for symptomatic PACs and PVCs treated with amiodarone--low-dose therapy for some years, taking 100 mg 3 days a week. Surveillance laboratories have been followed by Dr. Kristen Cardinal.  Patient denies symptoms of respiratory, GI intolerance, sun sensitivity, neurological symptoms attributable to amiodarone.      The patient denies chest pain, shortness of breath, nocturnal dyspnea, orthopnea or peripheral edema.  There have been no palpitations, lightheadedness or syncope.  Sleeps with her head of bed elevated because of her back.  DATE TEST EF   2008 Cath  No CAD  1/16 Myoview  75 % No ischemia  10/19 Myoview 75% No Ischemia   10/23 CTA  CaScore 422    Date Cr K TSH LFT (ALT ) LDL  2/19   2.92 15   8/19    2.72 17   2/21 1.0 4.2 1.597 20   3/22 1.1 4.1 3.04 31   9/22 1.1 4.1  28<<40 74  9/23 1.0 3.9 2.885 18   9/24  (CE) 1.0 3.6 3.6 19 64          Past Medical History:  Diagnosis Date   Abdominal discomfort    R upper quad   Actinic keratosis    Atypical chest pain    Basal cell carcinoma 10/24/2019   Left medial cheek/lateral to nose 3cm inferior to left medial canthus. Nodular pattern. Excised 12/04/2019, margins free.   Breast lump    CAD (coronary artery disease)    25% RCA 2008    Depression    GERD (gastroesophageal reflux disease)    HLD (hyperlipidemia)    HTN (hypertension)    Hypokalemia    Long QT interval    assoc with amiodarone   PAC (premature atrial contraction)    Right ventricular outflow tract premature ventricular contractions (PVCs)    Sleep apnea     Past Surgical History:  Procedure Laterality Date   APPENDECTOMY     bunion repairs     bilateral    CARDIAC CATHETERIZATION     [facility]   CARDIAC CATHETERIZATION     [facility]   CATARACT EXTRACTION Right    COLONOSCOPY     COLONOSCOPY WITH  PROPOFOL N/A 10/12/2022   Procedure: COLONOSCOPY WITH PROPOFOL;  Surgeon: Regis Bill, MD;  Location: [facility] ENDOSCOPY;  Service: Endoscopy;  Laterality: N/A;   lumbar laminextomy  2001   TOTAL ABDOMINAL HYSTERECTOMY W/ BILATERAL SALPINGOOPHORECTOMY     VESICOVAGINAL FISTULA CLOSURE W/ TAH      Current Outpatient Medications  Medication Sig Dispense Refill   amiodarone (PACERONE) 200 MG tablet Take half tablet (100mg ) by mouth daily.     amLODipine (NORVASC) 2.5 MG tablet Take 2.5 mg by mouth daily.     aspirin 81 MG EC tablet Take 81 mg by mouth daily.     atorvastatin (LIPITOR) 40 MG tablet Take 40 mg by mouth at bedtime.     calcium carbonate (OS-CAL) 600 MG TABS tablet Take 600 mg by mouth daily with breakfast.     cetirizine (ZYRTEC) 10 MG tablet Take 10 mg by mouth daily.     escitalopram (LEXAPRO) 10 MG tablet Take 10 mg by mouth at bedtime.      gabapentin (NEURONTIN) 300 MG capsule TAKE 3 CAPSULES BY MOUTH 90 MINUTES PRIOR TO BEDTIME     ketoconazole (  NIZORAL) 2 % shampoo Apply 1 Application topically 3 (three) times a week. 120 mL 11   pantoprazole (PROTONIX) 40 MG tablet TAKE 1 TABLET(40 MG) BY MOUTH EVERY DAY     valsartan-hydrochlorothiazide (DIOVAN-HCT) 320-25 MG per tablet Take 1 tablet by mouth daily.      No current facility-administered medications for this visit.    Allergies  Allergen Reactions   Sulfonamide Derivatives    Sulfa Antibiotics Itching and Rash    Review of Systems negative except from HPI and PMH  Physical Exam BP (!) 144/70 (BP Location: Left Arm, Patient Position: Sitting)   Pulse 68   Ht 5\' 2"  (1.575 m)   Wt 197 lb 12.8 oz (89.7 kg)   SpO2 96%   BMI 36.18 kg/m  Well developed and nourished in no acute distress HENT normal Neck supple with JVP-  flat  Clear Regular rate and rhythm, no murmurs or gallops Abd-soft with active BS No Clubbing cyanosis edema Skin-warm and dry A & Oriented  Grossly normal sensory and motor  function  ECG sinus @ 59 21/07/43    Assessment and  Plan  PACs/PVCs-symptomatic  Amiodarone therapy-high risk medication   Cardiomyopathy recovered  Coronary artery calcification  Sinus bradycardia/1 AVB  Hypertension  Non vaccinator  No interval palpitations.  Continue amiodarone at 100 mg daily.  Surveillance laboratories in range.  No attributable symptoms.  With recovered LV function in the absence of symptoms, we will continue her on her amlodipine and Diovan; no specific indication for SGLT2  Blood pressure reasonably controlled.  Continue on the aforementioned medications.  Coronary artery calcifications socially high appropriately on aspirin.  LDL is at target on Lipitor 40 mg daily

## 2023-11-04 ENCOUNTER — Other Ambulatory Visit: Payer: Self-pay | Admitting: Internal Medicine

## 2023-11-04 DIAGNOSIS — Z1231 Encounter for screening mammogram for malignant neoplasm of breast: Secondary | ICD-10-CM

## 2023-11-29 ENCOUNTER — Ambulatory Visit
Admission: RE | Admit: 2023-11-29 | Discharge: 2023-11-29 | Disposition: A | Discharge status: Home / Self Care | Payer: PPO | Source: Ambulatory Visit | Attending: Internal Medicine | Admitting: Internal Medicine

## 2023-11-29 DIAGNOSIS — Z1231 Encounter for screening mammogram for malignant neoplasm of breast: Secondary | ICD-10-CM | POA: Insufficient documentation

## 2023-12-28 ENCOUNTER — Encounter: Payer: Self-pay | Admitting: Dermatology

## 2023-12-28 ENCOUNTER — Ambulatory Visit (INDEPENDENT_AMBULATORY_CARE_PROVIDER_SITE_OTHER): Payer: PPO | Admitting: Dermatology

## 2023-12-28 DIAGNOSIS — C4401 Basal cell carcinoma of skin of lip: Secondary | ICD-10-CM

## 2023-12-28 DIAGNOSIS — D492 Neoplasm of unspecified behavior of bone, soft tissue, and skin: Secondary | ICD-10-CM

## 2023-12-28 DIAGNOSIS — C4491 Basal cell carcinoma of skin, unspecified: Secondary | ICD-10-CM

## 2023-12-28 HISTORY — DX: Basal cell carcinoma of skin, unspecified: C44.91

## 2023-12-28 NOTE — Progress Notes (Signed)
   Follow-Up Visit   Subjective  Christina Underwood is a 78 y.o. female who presents for the following: R nasolabial fold, 1-2 months, growing, no other symptoms The patient has spots, moles and lesions to be evaluated, some may be new or changing and the patient may have concern these could be cancer.   The following portions of the chart were reviewed this encounter and updated as appropriate: medications, allergies, medical history  Review of Systems:  No other skin or systemic complaints except as noted in HPI or Assessment and Plan.  Objective  Well appearing patient in no apparent distress; mood and affect are within normal limits.   A focused examination was performed of the following areas: face  Relevant exam findings are noted in the Assessment and Plan.  R upper cutaneous lip 4.21mm pink pap with telangiectasias   Assessment & Plan     NEOPLASM OF SKIN R upper cutaneous lip Skin / nail biopsy Type of biopsy: tangential   Informed consent: discussed and consent obtained   Timeout: patient name, date of birth, surgical site, and procedure verified   Procedure prep:  Patient was prepped and draped in usual sterile fashion Prep type:  Isopropyl alcohol  Anesthesia: the lesion was anesthetized in a standard fashion   Anesthetic:  1% lidocaine  w/ epinephrine 1-100,000 buffered w/ 8.4% NaHCO3 Instrument used: DermaBlade   Hemostasis achieved with: pressure and aluminum chloride   Outcome: patient tolerated procedure well   Post-procedure details: sterile dressing applied and wound care instructions given   Dressing type: bandage and petrolatum   Specimen 1 - Surgical pathology Differential Diagnosis: R/O BCC  Check Margins: No 4.19mm pink pap with telangiectasias Discussed if BCC will recommend mohs  Return for as scheduled with Dr. Hester.  I, Grayce Saunas, RMA, am acting as scribe for Boneta Sharps, MD .   Documentation: I have reviewed the above  documentation for accuracy and completeness, and I agree with the above.  Boneta Sharps, MD

## 2023-12-28 NOTE — Patient Instructions (Addendum)

## 2023-12-29 ENCOUNTER — Telehealth: Payer: Self-pay

## 2023-12-29 DIAGNOSIS — C44310 Basal cell carcinoma of skin of unspecified parts of face: Secondary | ICD-10-CM

## 2023-12-29 LAB — SURGICAL PATHOLOGY

## 2023-12-29 NOTE — Telephone Encounter (Signed)
 Advised pt of bx results and discussed mohs and locations.  Pt would like referral sent to Dr. Adriana Simas at [facility].    Referral has been emailed to Dr. Shiela Mayer office./sh

## 2023-12-29 NOTE — Telephone Encounter (Signed)
-----   Message from [place] sent at 12/29/2023  5:24 PM EST ----- Diagnosis: R upper cutaneous lip :       BASAL CELL CARCINOMA, NODULAR PATTERN    Please call with diagnosis and determine where the patient would like to have Mohs surgery.  Explanation: your biopsy shows a basal cell skin cancer in the second layer of the skin. This is the most common kind of skin cancer and is caused by damage from sun exposure. Basal cell skin cancers almost never spread beyond the skin, so they are not dangerous to your overall health. However, they will continue to grow, can bleed, cause nonhealing wounds, and disrupt nearby structures unless fully treated.  Treatment: Given the location and type of skin cancer, I recommend Mohs surgery. Mohs surgery involves cutting out the skin cancer and then checking under the microscope to ensure the whole skin cancer was removed. If any skin cancer remains, the surgeon will cut out more until it is fully removed. The cure rate is about 98-99%. Once the Mohs surgeon confirms the skin cancer is out, they will discuss the options to repair or heal the area. You must take it easy for about two weeks after surgery (no lifting over 10-15 lbs, avoid activity to get your heart rate and blood pressure up). It is done at another office outside of [place] ([place], [place], or [place]).

## 2024-01-30 ENCOUNTER — Other Ambulatory Visit: Payer: Self-pay

## 2024-01-30 DIAGNOSIS — C44319 Basal cell carcinoma of skin of other parts of face: Secondary | ICD-10-CM

## 2024-01-30 NOTE — Progress Notes (Signed)
 Patient came to office today and states that [facility] doesn't accept her insurance. Discussed other Mohs options with patient and she prefers [facility]s with Dr. Fain Home.

## 2024-02-20 ENCOUNTER — Encounter: Payer: Self-pay | Admitting: Dermatology

## 2024-02-22 ENCOUNTER — Ambulatory Visit: Payer: PPO | Admitting: Dermatology

## 2024-02-22 ENCOUNTER — Encounter: Payer: Self-pay | Admitting: Dermatology

## 2024-02-22 VITALS — BP 118/65 | HR 68 | Temp 97.9°F

## 2024-02-22 DIAGNOSIS — L814 Other melanin hyperpigmentation: Secondary | ICD-10-CM

## 2024-02-22 DIAGNOSIS — C4401 Basal cell carcinoma of skin of lip: Secondary | ICD-10-CM | POA: Diagnosis not present

## 2024-02-22 DIAGNOSIS — L579 Skin changes due to chronic exposure to nonionizing radiation, unspecified: Secondary | ICD-10-CM

## 2024-02-22 DIAGNOSIS — C4491 Basal cell carcinoma of skin, unspecified: Secondary | ICD-10-CM

## 2024-02-22 MED ORDER — TRAMADOL HCL 50 MG PO TABS: 50.0000 mg | ORAL_TABLET | Freq: Four times a day (QID) | ORAL | 0 refills | Status: AC | PRN

## 2024-02-22 NOTE — Progress Notes (Signed)
 Follow-Up Visit   Subjective  Christina Underwood is a 78 y.o. female who presents for the following: Mohs of a nodular Basal Cell Carcinoma of the right cutaneous lip, referred by Dr. Katrinka Blazing.  The following portions of the chart were reviewed this encounter and updated as appropriate: medications, allergies, medical history  Review of Systems:  No other skin or systemic complaints except as noted in HPI or Assessment and Plan.  Objective  Well appearing patient in no apparent distress; mood and affect are within normal limits.  A focused examination was performed of the following areas: Right cutaneous lip Relevant physical exam findings are noted in the Assessment and Plan.     Assessment & Plan   BASAL CELL CARCINOMA (BCC), UNSPECIFIED SITE Right Upper Cutaneous Lip Mohs surgery  Consent obtained: written  Anticoagulation: Was the anticoagulation regimen changed prior to Mohs? No    Anesthesia: Anesthesia method: local infiltration Local anesthetic: lidocaine 1% WITH epi  Procedure Details: Timeout: pre-procedure verification complete Procedure Prep: patient was prepped and draped in usual sterile fashion Prep type: chlorhexidine Pre-Op diagnosis: basal cell carcinoma BCC subtype: nodular MohsAIQ Surgical site (if tumor spans multiple areas, please select predominant area): cutaneous lip Surgery side: right Surgical site (from skin exam): Right Upper Cutaneous Lip Pre-operative length (cm): 0.5 Pre-operative width (cm): 0.5 Indications for Mohs surgery: anatomic location where tissue conservation is critical  Micrographic Surgery Details: Post-operative length (cm): 1.3 Post-operative width (cm): 1.6 Number of Mohs stages: 3  Skin repair Complexity:  Complex Final length (cm):  3.4 Informed consent: discussed and consent obtained   Timeout: patient name, date of birth, surgical site, and procedure verified   Procedure prep:  Patient was prepped and draped in  usual sterile fashion Prep type:  Chlorhexidine Anesthesia: the lesion was anesthetized in a standard fashion   Anesthetic:  1% lidocaine w/ epinephrine 1-100,000 buffered w/ 8.4% NaHCO3 Reason for type of repair: reduce tension to allow closure, allow closure of the large defect, preserve normal anatomy, preserve normal anatomical and functional relationships and allow side-to-side closure without requiring a flap or graft   Undermining: area extensively undermined   Subcutaneous layers (deep stitches):  Suture size:  5-0 Suture type: Monocryl (poliglecaprone 25)   Stitches:  Buried vertical mattress Fine/surface layer approximation (top stitches):  Suture size:  6-0 Suture type: fast-absorbing plain gut   Stitches: simple running   Hemostasis achieved with: suture, pressure and electrodesiccation Outcome: patient tolerated procedure well with no complications   Post-procedure details: sterile dressing applied and wound care instructions given   Dressing type: bandage and pressure dressing   Related Medications traMADol (ULTRAM) 50 MG tablet Take 1 tablet (50 mg total) by mouth every 6 (six) hours as needed for up to 8 doses.  Return in about 4 weeks (around 03/21/2024) for wound check.  Owens Shark, CMA, am acting as scribe for Gwenith Daily, MD.    02/22/2024  HISTORY OF PRESENT ILLNESS  Christina Underwood is seen in consultation at the request of Dr. Katrinka Blazing for biopsy-proven Nodular Basal Cell Carcinoma of the right cutaneous lip. They note that the area has been present for about 6 months increasing in size with time.  There is no history of previous treatment.  Reports no other new or changing lesions and has no other complaints today.  Medications and allergies: see patient chart.  Review of systems: Reviewed 8 systems and notable for the above skin cancer.  All other systems  reviewed are unremarkable/negative, unless noted in the HPI. Past medical history, surgical history,  family history, social history were also reviewed and are noted in the chart/questionnaire.    PHYSICAL EXAMINATION  General: Well-appearing, in no acute distress, alert and oriented x 4. Vitals reviewed in chart (if available).   Skin: Exam reveals a 0.5 x 0.5 cm erythematous papule and biopsy scar on the right cutaneous lip. There are rhytids, telangiectasias, and lentigines, consistent with photodamage.   Biopsy report(s) reviewed, confirming the diagnosis.   ASSESSMENT  1) Nodular Basal Cell Carcinoma of the right upper cutaneous lip 2) photodamage 3) solar lentigines   PLAN   1. Due to location, size, histology, or recurrence and the likelihood of subclinical extension as well as the need to conserve normal surrounding tissue, the patient was deemed acceptable for Mohs micrographic surgery (MMS).  The nature and purpose of the procedure, associated benefits and risks including recurrence and scarring, possible complications such as pain, infection, and bleeding, and alternative methods of treatment if appropriate were discussed with the patient during consent. The lesion location was verified by the patient, by reviewing previous notes, pathology reports, and by photographs as well as angulation measurements if available.  Informed consent was reviewed and signed by the patient, and timeout was performed at 8:30 AM. See op note below.  2. For the photodamage and solar lentigines, sun protection discussed/information given on OTC sunscreens, and we recommend continued regular follow-up with primary dermatologist every 6 months or sooner for any growing, bleeding, or changing lesions. 3. Prognosis and future surveillance discussed. 4. Letter with treatment outcome sent to referring provider. 5. Pain acetaminophen/ibuprofen/tramadol 50 mg  MOHS MICROGRAPHIC SURGERY AND RECONSTRUCTION  Initial size:   0.5 x 0.5 cm Surgical defect/wound size: 1.3 x 1.6 cm Anesthesia:    0.33% lidocaine  with 1:200,000 epinephrine EBL:    <5 mL Complications:  None Repair type:   Complex SQ suture:   5-0 Monocryl Cutaneous suture:  6-0 Plain gut Final size of the repair: 3.4 cm  Stages: 3  STAGE I: Anesthesia achieved with 0.5% lidocaine with 1:200,000 epinephrine. ChloraPrep applied. 2 section(s) excised using Mohs technique (this includes total peripheral and deep tissue margin excision and evaluation with frozen sections, excised and interpreted by the same physician). The tumor was first debulked and then excised with an approx. 2 mm margin.  Hemostasis was achieved with electrocautery as needed.  The specimen was then oriented, subdivided/relaxed, inked, and processed using Mohs technique.    Frozen section analysis revealed a positive margin for islands of cells with peripheral palisading and a haphazard arrangement of the more central cells in the deep and peripheral margin.    STAGE II: An additional 2 mm margin was excised.  Hemostasis was achieved with electrocautery as needed.  The specimen was then oriented, subdivided/relaxed, inked, and processed using Mohs technique.   Frozen section analysis revealed a positive margin for islands of cells with peripheral palisading and a haphazard arrangement of the more central cell in the peripheral margin.  STAGE III: An additional 2 mm margin was excised.  Hemostasis was achieved with electrocautery as needed.  The specimen was then oriented, subdivided/relaxed, inked, and processed using Mohs technique. Evaluation of slides by the Mohs surgeon revealed clear tumor margins.  Reconstruction  The surgical wound was then cleaned, prepped, and re-anesthetized as above. Wound edges were undermined extensively along at least one entire edge and at a distance equal to or greater than the width  of the defect (see wound defect size above) in order to achieve closure and decrease wound tension and anatomic distortion. Redundant tissue repair including  standing cone removal was performed. Hemostasis was achieved with electrocautery. Subcutaneous and epidermal tissues were approximated with the above sutures. The surgical site was then lightly scrubbed with sterile, saline-soaked gauze. The area was then bandaged using Vaseline ointment, non-adherent gauze, gauze pads, and tape to provide an adequate pressure dressing. The patient tolerated the procedure well, was given detailed written and verbal wound care instructions, and was discharged in good condition.   The patient will follow-up: 4 weeks.   Documentation: I have reviewed the above documentation for accuracy and completeness, and I agree with the above.  Gwenith Daily, MD

## 2024-02-22 NOTE — Patient Instructions (Signed)

## 2024-02-23 DIAGNOSIS — M79672 Pain in left foot: Secondary | ICD-10-CM | POA: Diagnosis not present

## 2024-02-23 DIAGNOSIS — M545 Low back pain, unspecified: Secondary | ICD-10-CM | POA: Diagnosis not present

## 2024-02-23 DIAGNOSIS — G8929 Other chronic pain: Secondary | ICD-10-CM | POA: Diagnosis not present

## 2024-02-23 DIAGNOSIS — M79671 Pain in right foot: Secondary | ICD-10-CM | POA: Diagnosis not present

## 2024-03-01 ENCOUNTER — Encounter: Payer: Self-pay | Admitting: Dermatology

## 2024-03-15 ENCOUNTER — Ambulatory Visit: Payer: PPO | Admitting: Dermatology

## 2024-03-16 DIAGNOSIS — H47322 Drusen of optic disc, left eye: Secondary | ICD-10-CM | POA: Diagnosis not present

## 2024-03-16 DIAGNOSIS — H2512 Age-related nuclear cataract, left eye: Secondary | ICD-10-CM | POA: Diagnosis not present

## 2024-03-16 DIAGNOSIS — H2000 Unspecified acute and subacute iridocyclitis: Secondary | ICD-10-CM | POA: Diagnosis not present

## 2024-03-16 DIAGNOSIS — H35371 Puckering of macula, right eye: Secondary | ICD-10-CM | POA: Diagnosis not present

## 2024-03-26 ENCOUNTER — Ambulatory Visit: Admitting: Dermatology

## 2024-03-26 ENCOUNTER — Encounter: Payer: Self-pay | Admitting: Dermatology

## 2024-03-26 VITALS — BP 136/64 | HR 68

## 2024-03-26 DIAGNOSIS — L539 Erythematous condition, unspecified: Secondary | ICD-10-CM | POA: Diagnosis not present

## 2024-03-26 DIAGNOSIS — Z85828 Personal history of other malignant neoplasm of skin: Secondary | ICD-10-CM

## 2024-03-26 DIAGNOSIS — T1490XD Injury, unspecified, subsequent encounter: Secondary | ICD-10-CM

## 2024-03-26 DIAGNOSIS — C4401 Basal cell carcinoma of skin of lip: Secondary | ICD-10-CM

## 2024-03-26 DIAGNOSIS — L905 Scar conditions and fibrosis of skin: Secondary | ICD-10-CM

## 2024-03-26 DIAGNOSIS — C4491 Basal cell carcinoma of skin, unspecified: Secondary | ICD-10-CM | POA: Insufficient documentation

## 2024-03-26 NOTE — Progress Notes (Signed)
   Follow Up Visit   Subjective  Christina Underwood is a 78 y.o. female who presents for the following: follow up from Mohs surgery   The patient presents for follow up from Mohs surgery for a BCC on the right upper lip, treated on 02/22/24, repaired with linear repair. The patient has been bandaging the wound as directed. The endorse the following concerns: no questions or concerns at this point.  The following portions of the chart were reviewed this encounter and updated as appropriate: medications, allergies, medical history  Review of Systems:  No other skin or systemic complaints except as noted in HPI or Assessment and Plan.  Objective  Well appearing patient in no apparent distress; mood and affect are within normal limits.  A full examination was performed including scalp, head, face and lip. All findings within normal limits unless otherwise noted below.  Healing wound with mild erythema  Relevant physical exam findings are noted in the Assessment and Plan.     Assessment & Plan   Healing s/p Mohs for [facility], treated on 02/22/24, repaired with linear closure. - Reassured that wound is healing well - No evidence of infection - No swelling, induration, purulence, dehiscence, or tenderness out of proportion to the clinical exam, see photo above - Discussed that scars take up to 12 months to mature from the date of surgery - Recommend SPF 30+ to scar daily to prevent purple color from UV exposure during scar maturation process - Discussed that erythema and raised appearance of scar will fade over the next 4-6 months - OK to start scar massage at 4-6 weeks post-op - Can consider silicone based products for scar healing starting at 6 weeks post-op - Ok to discontinue ointment daily to wound  HISTORY OF BASAL CELL CARCINOMA OF THE SKIN - No evidence of recurrence today - Recommend regular full body skin exams - Recommend daily broad spectrum sunscreen SPF 30+ to sun-exposed areas,  reapply every 2 hours as needed.  - Call if any new or changing lesions are noted between office visits  Return if symptoms worsen or fail to improve.  I, Manual Meier, Surg Tech III, am acting as scribe for Gwenith Daily, MD.   Documentation: I have reviewed the above documentation for accuracy and completeness, and I agree with the above.  Gwenith Daily, MD

## 2024-03-26 NOTE — Patient Instructions (Signed)

## 2024-04-10 DIAGNOSIS — E785 Hyperlipidemia, unspecified: Secondary | ICD-10-CM | POA: Diagnosis not present

## 2024-04-10 DIAGNOSIS — I10 Essential (primary) hypertension: Secondary | ICD-10-CM | POA: Diagnosis not present

## 2024-04-10 DIAGNOSIS — E538 Deficiency of other specified B group vitamins: Secondary | ICD-10-CM | POA: Diagnosis not present

## 2024-04-10 DIAGNOSIS — N1831 Chronic kidney disease, stage 3a: Secondary | ICD-10-CM | POA: Diagnosis not present

## 2024-04-17 DIAGNOSIS — J8410 Pulmonary fibrosis, unspecified: Secondary | ICD-10-CM | POA: Diagnosis not present

## 2024-04-17 DIAGNOSIS — K219 Gastro-esophageal reflux disease without esophagitis: Secondary | ICD-10-CM | POA: Diagnosis not present

## 2024-04-17 DIAGNOSIS — I700 Atherosclerosis of aorta: Secondary | ICD-10-CM | POA: Diagnosis not present

## 2024-04-17 DIAGNOSIS — I480 Paroxysmal atrial fibrillation: Secondary | ICD-10-CM | POA: Diagnosis not present

## 2024-04-17 DIAGNOSIS — I10 Essential (primary) hypertension: Secondary | ICD-10-CM | POA: Diagnosis not present

## 2024-04-17 DIAGNOSIS — N1831 Chronic kidney disease, stage 3a: Secondary | ICD-10-CM | POA: Diagnosis not present

## 2024-04-17 DIAGNOSIS — M519 Unspecified thoracic, thoracolumbar and lumbosacral intervertebral disc disorder: Secondary | ICD-10-CM | POA: Diagnosis not present

## 2024-04-17 DIAGNOSIS — H35033 Hypertensive retinopathy, bilateral: Secondary | ICD-10-CM | POA: Diagnosis not present

## 2024-04-17 DIAGNOSIS — I4581 Long QT syndrome: Secondary | ICD-10-CM | POA: Diagnosis not present

## 2024-04-17 DIAGNOSIS — F3342 Major depressive disorder, recurrent, in full remission: Secondary | ICD-10-CM | POA: Diagnosis not present

## 2024-04-17 DIAGNOSIS — E538 Deficiency of other specified B group vitamins: Secondary | ICD-10-CM | POA: Diagnosis not present

## 2024-04-17 DIAGNOSIS — Z0000 Encounter for general adult medical examination without abnormal findings: Secondary | ICD-10-CM | POA: Diagnosis not present

## 2024-04-23 ENCOUNTER — Ambulatory Visit: Admitting: Dermatology

## 2024-04-23 ENCOUNTER — Encounter: Payer: Self-pay | Admitting: Dermatology

## 2024-04-23 DIAGNOSIS — L821 Other seborrheic keratosis: Secondary | ICD-10-CM | POA: Diagnosis not present

## 2024-04-23 DIAGNOSIS — Z85828 Personal history of other malignant neoplasm of skin: Secondary | ICD-10-CM

## 2024-04-23 DIAGNOSIS — L578 Other skin changes due to chronic exposure to nonionizing radiation: Secondary | ICD-10-CM | POA: Diagnosis not present

## 2024-04-23 DIAGNOSIS — Z1283 Encounter for screening for malignant neoplasm of skin: Secondary | ICD-10-CM | POA: Diagnosis not present

## 2024-04-23 DIAGNOSIS — L814 Other melanin hyperpigmentation: Secondary | ICD-10-CM

## 2024-04-23 DIAGNOSIS — D229 Melanocytic nevi, unspecified: Secondary | ICD-10-CM

## 2024-04-23 DIAGNOSIS — D1801 Hemangioma of skin and subcutaneous tissue: Secondary | ICD-10-CM | POA: Diagnosis not present

## 2024-04-23 DIAGNOSIS — W908XXA Exposure to other nonionizing radiation, initial encounter: Secondary | ICD-10-CM | POA: Diagnosis not present

## 2024-04-23 NOTE — Patient Instructions (Signed)

## 2024-04-23 NOTE — Progress Notes (Signed)
   Follow-Up Visit   Subjective  Christina Underwood is a 78 y.o. female who presents for the following: Skin Cancer Screening and Full Body Skin Exam, hx of BCC  The patient presents for Total-Body Skin Exam (TBSE) for skin cancer screening and mole check. The patient has spots, moles and lesions to be evaluated, some may be new or changing and the patient may have concern these could be cancer.  The following portions of the chart were reviewed this encounter and updated as appropriate: medications, allergies, medical history  Review of Systems:  No other skin or systemic complaints except as noted in HPI or Assessment and Plan.  Objective  Well appearing patient in no apparent distress; mood and affect are within normal limits.  A full examination was performed including scalp, head, eyes, ears, nose, lips, neck, chest, axillae, abdomen, back, buttocks, bilateral upper extremities, bilateral lower extremities, hands, feet, fingers, toes, fingernails, and toenails. All findings within normal limits unless otherwise noted below.   Relevant physical exam findings are noted in the Assessment and Plan.   Assessment & Plan   SKIN CANCER SCREENING PERFORMED TODAY.  ACTINIC DAMAGE - Chronic condition, secondary to cumulative UV/sun exposure - diffuse scaly erythematous macules with underlying dyspigmentation - Recommend daily broad spectrum sunscreen SPF 30+ to sun-exposed areas, reapply every 2 hours as needed.  - Staying in the shade or wearing long sleeves, sun glasses (UVA+UVB protection) and wide brim hats (4-inch brim around the entire circumference of the hat) are also recommended for sun protection.  - Call for new or changing lesions.  LENTIGINES, SEBORRHEIC KERATOSES, HEMANGIOMAS - Benign normal skin lesions - Benign-appearing - Call for any changes  MELANOCYTIC NEVI - Tan-brown and/or pink-flesh-colored symmetric macules and papules - Benign appearing on exam today -  Observation - Call clinic for new or changing moles - Recommend daily use of broad spectrum spf 30+ sunscreen to sun-exposed areas.   HISTORY OF BASAL CELL CARCINOMA OF THE SKIN - No evidence of recurrence today - Recommend regular full body skin exams - Recommend daily broad spectrum sunscreen SPF 30+ to sun-exposed areas, reapply every 2 hours as needed.  - Call if any new or changing lesions are noted between office visits    Return in about 1 year (around 04/23/2025) for TBSE, hx of BCC .  IClara Crisp, CMA, am acting as scribe for Celine Collard, MD .   Documentation: I have reviewed the above documentation for accuracy and completeness, and I agree with the above.  Celine Collard, MD

## 2024-04-27 DIAGNOSIS — H6121 Impacted cerumen, right ear: Secondary | ICD-10-CM | POA: Diagnosis not present

## 2024-04-27 DIAGNOSIS — H938X1 Other specified disorders of right ear: Secondary | ICD-10-CM | POA: Diagnosis not present

## 2024-05-17 ENCOUNTER — Encounter: Payer: Self-pay | Admitting: Dermatology

## 2024-05-18 DIAGNOSIS — M48062 Spinal stenosis, lumbar region with neurogenic claudication: Secondary | ICD-10-CM | POA: Diagnosis not present

## 2024-07-06 DIAGNOSIS — M48062 Spinal stenosis, lumbar region with neurogenic claudication: Secondary | ICD-10-CM | POA: Diagnosis not present

## 2024-07-06 DIAGNOSIS — M47816 Spondylosis without myelopathy or radiculopathy, lumbar region: Secondary | ICD-10-CM | POA: Diagnosis not present

## 2024-07-06 DIAGNOSIS — M48061 Spinal stenosis, lumbar region without neurogenic claudication: Secondary | ICD-10-CM | POA: Diagnosis not present

## 2024-07-06 DIAGNOSIS — M47817 Spondylosis without myelopathy or radiculopathy, lumbosacral region: Secondary | ICD-10-CM | POA: Diagnosis not present

## 2024-07-17 DIAGNOSIS — M9904 Segmental and somatic dysfunction of sacral region: Secondary | ICD-10-CM | POA: Diagnosis not present

## 2024-07-17 DIAGNOSIS — M9903 Segmental and somatic dysfunction of lumbar region: Secondary | ICD-10-CM | POA: Diagnosis not present

## 2024-07-17 DIAGNOSIS — M5442 Lumbago with sciatica, left side: Secondary | ICD-10-CM | POA: Diagnosis not present

## 2024-07-17 DIAGNOSIS — M461 Sacroiliitis, not elsewhere classified: Secondary | ICD-10-CM | POA: Diagnosis not present

## 2024-07-19 DIAGNOSIS — M5442 Lumbago with sciatica, left side: Secondary | ICD-10-CM | POA: Diagnosis not present

## 2024-07-19 DIAGNOSIS — M461 Sacroiliitis, not elsewhere classified: Secondary | ICD-10-CM | POA: Diagnosis not present

## 2024-07-19 DIAGNOSIS — M9903 Segmental and somatic dysfunction of lumbar region: Secondary | ICD-10-CM | POA: Diagnosis not present

## 2024-07-19 DIAGNOSIS — M9904 Segmental and somatic dysfunction of sacral region: Secondary | ICD-10-CM | POA: Diagnosis not present

## 2024-07-23 DIAGNOSIS — M461 Sacroiliitis, not elsewhere classified: Secondary | ICD-10-CM | POA: Diagnosis not present

## 2024-07-23 DIAGNOSIS — M9904 Segmental and somatic dysfunction of sacral region: Secondary | ICD-10-CM | POA: Diagnosis not present

## 2024-07-23 DIAGNOSIS — M9903 Segmental and somatic dysfunction of lumbar region: Secondary | ICD-10-CM | POA: Diagnosis not present

## 2024-07-23 DIAGNOSIS — M5442 Lumbago with sciatica, left side: Secondary | ICD-10-CM | POA: Diagnosis not present

## 2024-07-26 DIAGNOSIS — M9903 Segmental and somatic dysfunction of lumbar region: Secondary | ICD-10-CM | POA: Diagnosis not present

## 2024-07-26 DIAGNOSIS — M9904 Segmental and somatic dysfunction of sacral region: Secondary | ICD-10-CM | POA: Diagnosis not present

## 2024-07-26 DIAGNOSIS — M5442 Lumbago with sciatica, left side: Secondary | ICD-10-CM | POA: Diagnosis not present

## 2024-07-26 DIAGNOSIS — M461 Sacroiliitis, not elsewhere classified: Secondary | ICD-10-CM | POA: Diagnosis not present

## 2024-07-30 DIAGNOSIS — M5442 Lumbago with sciatica, left side: Secondary | ICD-10-CM | POA: Diagnosis not present

## 2024-07-30 DIAGNOSIS — M9903 Segmental and somatic dysfunction of lumbar region: Secondary | ICD-10-CM | POA: Diagnosis not present

## 2024-07-30 DIAGNOSIS — M9904 Segmental and somatic dysfunction of sacral region: Secondary | ICD-10-CM | POA: Diagnosis not present

## 2024-07-30 DIAGNOSIS — M25561 Pain in right knee: Secondary | ICD-10-CM | POA: Diagnosis not present

## 2024-07-30 DIAGNOSIS — M461 Sacroiliitis, not elsewhere classified: Secondary | ICD-10-CM | POA: Diagnosis not present

## 2024-08-02 DIAGNOSIS — M461 Sacroiliitis, not elsewhere classified: Secondary | ICD-10-CM | POA: Diagnosis not present

## 2024-08-02 DIAGNOSIS — M25561 Pain in right knee: Secondary | ICD-10-CM | POA: Diagnosis not present

## 2024-08-02 DIAGNOSIS — M9903 Segmental and somatic dysfunction of lumbar region: Secondary | ICD-10-CM | POA: Diagnosis not present

## 2024-08-02 DIAGNOSIS — M9904 Segmental and somatic dysfunction of sacral region: Secondary | ICD-10-CM | POA: Diagnosis not present

## 2024-08-02 DIAGNOSIS — M5442 Lumbago with sciatica, left side: Secondary | ICD-10-CM | POA: Diagnosis not present

## 2024-08-03 DIAGNOSIS — M1711 Unilateral primary osteoarthritis, right knee: Secondary | ICD-10-CM | POA: Diagnosis not present

## 2024-08-06 DIAGNOSIS — M9904 Segmental and somatic dysfunction of sacral region: Secondary | ICD-10-CM | POA: Diagnosis not present

## 2024-08-06 DIAGNOSIS — M9903 Segmental and somatic dysfunction of lumbar region: Secondary | ICD-10-CM | POA: Diagnosis not present

## 2024-08-06 DIAGNOSIS — M25561 Pain in right knee: Secondary | ICD-10-CM | POA: Diagnosis not present

## 2024-08-06 DIAGNOSIS — M5442 Lumbago with sciatica, left side: Secondary | ICD-10-CM | POA: Diagnosis not present

## 2024-08-06 DIAGNOSIS — M461 Sacroiliitis, not elsewhere classified: Secondary | ICD-10-CM | POA: Diagnosis not present

## 2024-08-28 DIAGNOSIS — M25561 Pain in right knee: Secondary | ICD-10-CM | POA: Diagnosis not present

## 2024-08-28 DIAGNOSIS — M5442 Lumbago with sciatica, left side: Secondary | ICD-10-CM | POA: Diagnosis not present

## 2024-08-28 DIAGNOSIS — M461 Sacroiliitis, not elsewhere classified: Secondary | ICD-10-CM | POA: Diagnosis not present

## 2024-08-28 DIAGNOSIS — M9903 Segmental and somatic dysfunction of lumbar region: Secondary | ICD-10-CM | POA: Diagnosis not present

## 2024-08-28 DIAGNOSIS — M9904 Segmental and somatic dysfunction of sacral region: Secondary | ICD-10-CM | POA: Diagnosis not present

## 2024-09-25 DIAGNOSIS — M9903 Segmental and somatic dysfunction of lumbar region: Secondary | ICD-10-CM | POA: Diagnosis not present

## 2024-09-25 DIAGNOSIS — M25561 Pain in right knee: Secondary | ICD-10-CM | POA: Diagnosis not present

## 2024-09-25 DIAGNOSIS — M5442 Lumbago with sciatica, left side: Secondary | ICD-10-CM | POA: Diagnosis not present

## 2024-09-25 DIAGNOSIS — M461 Sacroiliitis, not elsewhere classified: Secondary | ICD-10-CM | POA: Diagnosis not present

## 2024-09-25 DIAGNOSIS — M9904 Segmental and somatic dysfunction of sacral region: Secondary | ICD-10-CM | POA: Diagnosis not present

## 2024-10-11 DIAGNOSIS — Z131 Encounter for screening for diabetes mellitus: Secondary | ICD-10-CM | POA: Diagnosis not present

## 2024-10-11 DIAGNOSIS — N1831 Chronic kidney disease, stage 3a: Secondary | ICD-10-CM | POA: Diagnosis not present

## 2024-10-11 DIAGNOSIS — E785 Hyperlipidemia, unspecified: Secondary | ICD-10-CM | POA: Diagnosis not present

## 2024-10-11 DIAGNOSIS — E538 Deficiency of other specified B group vitamins: Secondary | ICD-10-CM | POA: Diagnosis not present

## 2024-10-18 DIAGNOSIS — G4733 Obstructive sleep apnea (adult) (pediatric): Secondary | ICD-10-CM | POA: Diagnosis not present

## 2024-10-18 DIAGNOSIS — F331 Major depressive disorder, recurrent, moderate: Secondary | ICD-10-CM | POA: Diagnosis not present

## 2024-10-18 DIAGNOSIS — E538 Deficiency of other specified B group vitamins: Secondary | ICD-10-CM | POA: Diagnosis not present

## 2024-10-18 DIAGNOSIS — M519 Unspecified thoracic, thoracolumbar and lumbosacral intervertebral disc disorder: Secondary | ICD-10-CM | POA: Diagnosis not present

## 2024-10-18 DIAGNOSIS — R7303 Prediabetes: Secondary | ICD-10-CM | POA: Diagnosis not present

## 2024-10-18 DIAGNOSIS — E785 Hyperlipidemia, unspecified: Secondary | ICD-10-CM | POA: Diagnosis not present

## 2024-10-18 DIAGNOSIS — N1831 Chronic kidney disease, stage 3a: Secondary | ICD-10-CM | POA: Diagnosis not present

## 2024-10-18 DIAGNOSIS — Z23 Encounter for immunization: Secondary | ICD-10-CM | POA: Diagnosis not present

## 2024-10-18 DIAGNOSIS — I10 Essential (primary) hypertension: Secondary | ICD-10-CM | POA: Diagnosis not present

## 2024-10-18 DIAGNOSIS — J8410 Pulmonary fibrosis, unspecified: Secondary | ICD-10-CM | POA: Diagnosis not present

## 2024-10-18 DIAGNOSIS — Z780 Asymptomatic menopausal state: Secondary | ICD-10-CM | POA: Diagnosis not present

## 2024-10-18 DIAGNOSIS — I480 Paroxysmal atrial fibrillation: Secondary | ICD-10-CM | POA: Diagnosis not present

## 2024-10-23 DIAGNOSIS — M461 Sacroiliitis, not elsewhere classified: Secondary | ICD-10-CM | POA: Diagnosis not present

## 2024-10-23 DIAGNOSIS — M9903 Segmental and somatic dysfunction of lumbar region: Secondary | ICD-10-CM | POA: Diagnosis not present

## 2024-10-23 DIAGNOSIS — M9904 Segmental and somatic dysfunction of sacral region: Secondary | ICD-10-CM | POA: Diagnosis not present

## 2024-10-23 DIAGNOSIS — M5442 Lumbago with sciatica, left side: Secondary | ICD-10-CM | POA: Diagnosis not present

## 2024-10-23 DIAGNOSIS — M25561 Pain in right knee: Secondary | ICD-10-CM | POA: Diagnosis not present

## 2024-10-24 ENCOUNTER — Other Ambulatory Visit: Payer: Self-pay | Admitting: Internal Medicine

## 2024-10-24 DIAGNOSIS — Z1231 Encounter for screening mammogram for malignant neoplasm of breast: Secondary | ICD-10-CM

## 2024-11-01 DIAGNOSIS — M8588 Other specified disorders of bone density and structure, other site: Secondary | ICD-10-CM | POA: Diagnosis not present

## 2024-11-29 ENCOUNTER — Ambulatory Visit
Admission: RE | Admit: 2024-11-29 | Discharge: 2024-11-29 | Disposition: A | Discharge status: Home / Self Care | Source: Ambulatory Visit | Attending: Internal Medicine | Admitting: Internal Medicine

## 2024-11-29 DIAGNOSIS — Z1231 Encounter for screening mammogram for malignant neoplasm of breast: Secondary | ICD-10-CM | POA: Diagnosis present

## 2025-01-14 ENCOUNTER — Ambulatory Visit: Admitting: Dermatology

## 2025-01-21 ENCOUNTER — Ambulatory Visit: Admitting: Dermatology

## 2025-02-04 ENCOUNTER — Ambulatory Visit: Admitting: Dermatology

## 2025-04-23 ENCOUNTER — Ambulatory Visit: Admitting: Dermatology
